# Patient Record
Sex: Male | Born: 1970 | Race: White | Hispanic: No | Smoking: Never smoker
Health system: Southern US, Community
[De-identification: ages and names within clinical notes are randomized; demographics above are authoritative.]

## PROBLEM LIST (undated history)

## (undated) DIAGNOSIS — I10 Essential (primary) hypertension: Secondary | ICD-10-CM

## (undated) DIAGNOSIS — E785 Hyperlipidemia, unspecified: Secondary | ICD-10-CM

## (undated) HISTORY — DX: Essential (primary) hypertension: I10

## (undated) HISTORY — PX: OTHER SURGICAL HISTORY: SHX169

## (undated) HISTORY — DX: Hyperlipidemia, unspecified: E78.5

---

## 2001-09-18 ENCOUNTER — Emergency Department (HOSPITAL_COMMUNITY): Admission: EM | Admit: 2001-09-18 | Discharge: 2001-09-18 | Payer: Self-pay | Admitting: Emergency Medicine

## 2001-09-18 ENCOUNTER — Encounter: Payer: Self-pay | Admitting: Emergency Medicine

## 2003-07-22 ENCOUNTER — Emergency Department (HOSPITAL_COMMUNITY): Admission: EM | Admit: 2003-07-22 | Discharge: 2003-07-22 | Payer: Self-pay

## 2004-09-08 ENCOUNTER — Ambulatory Visit: Payer: Self-pay | Admitting: Internal Medicine

## 2004-09-21 ENCOUNTER — Ambulatory Visit: Payer: Self-pay | Admitting: Internal Medicine

## 2004-12-03 ENCOUNTER — Ambulatory Visit: Payer: Self-pay | Admitting: Internal Medicine

## 2004-12-06 ENCOUNTER — Ambulatory Visit: Payer: Self-pay | Admitting: Internal Medicine

## 2005-02-01 ENCOUNTER — Emergency Department (HOSPITAL_COMMUNITY): Admission: EM | Admit: 2005-02-01 | Discharge: 2005-02-01 | Payer: Self-pay | Admitting: Family Medicine

## 2005-02-05 ENCOUNTER — Emergency Department (HOSPITAL_COMMUNITY): Admission: EM | Admit: 2005-02-05 | Discharge: 2005-02-05 | Payer: Self-pay | Admitting: Family Medicine

## 2005-04-19 ENCOUNTER — Ambulatory Visit: Payer: Self-pay | Admitting: Internal Medicine

## 2005-05-24 ENCOUNTER — Ambulatory Visit: Payer: Self-pay | Admitting: Internal Medicine

## 2005-07-26 ENCOUNTER — Ambulatory Visit: Payer: Self-pay | Admitting: Internal Medicine

## 2005-08-02 ENCOUNTER — Ambulatory Visit: Payer: Self-pay | Admitting: Internal Medicine

## 2005-08-04 ENCOUNTER — Encounter: Admission: RE | Admit: 2005-08-04 | Discharge: 2005-08-04 | Payer: Self-pay | Admitting: Internal Medicine

## 2005-08-18 ENCOUNTER — Ambulatory Visit: Payer: Self-pay | Admitting: Internal Medicine

## 2005-11-03 ENCOUNTER — Ambulatory Visit: Payer: Self-pay | Admitting: Internal Medicine

## 2005-11-10 ENCOUNTER — Ambulatory Visit: Payer: Self-pay | Admitting: Internal Medicine

## 2006-02-17 ENCOUNTER — Ambulatory Visit: Payer: Self-pay | Admitting: Internal Medicine

## 2006-03-14 ENCOUNTER — Ambulatory Visit: Payer: Self-pay | Admitting: Internal Medicine

## 2006-06-06 ENCOUNTER — Ambulatory Visit: Payer: Self-pay | Admitting: Internal Medicine

## 2006-06-13 ENCOUNTER — Ambulatory Visit: Payer: Self-pay | Admitting: Internal Medicine

## 2006-09-21 ENCOUNTER — Ambulatory Visit: Payer: Self-pay | Admitting: Internal Medicine

## 2006-09-21 LAB — CONVERTED CEMR LAB
ALT: 85 units/L — ABNORMAL HIGH (ref 0–40)
AST: 40 units/L — ABNORMAL HIGH (ref 0–37)
Albumin: 3.6 g/dL (ref 3.5–5.2)
Alkaline Phosphatase: 55 units/L (ref 39–117)
Bilirubin, Direct: 0.1 mg/dL (ref 0.0–0.3)
Chol/HDL Ratio, serum: 7
Cholesterol: 207 mg/dL (ref 0–200)
HDL: 29.7 mg/dL — ABNORMAL LOW (ref >39.0)
LDL DIRECT: 138.2 mg/dL
Total Bilirubin: 0.9 mg/dL (ref 0.3–1.2)
Total Protein: 6.9 g/dL (ref 6.0–8.3)
Triglyceride fasting, serum: 152 mg/dL — ABNORMAL HIGH (ref 0–149)
VLDL: 30 mg/dL (ref 0–40)

## 2006-10-09 ENCOUNTER — Ambulatory Visit: Payer: Self-pay | Admitting: Internal Medicine

## 2007-02-21 ENCOUNTER — Ambulatory Visit: Payer: Self-pay | Admitting: Internal Medicine

## 2007-02-21 LAB — CONVERTED CEMR LAB
ALT: 45 units/L — ABNORMAL HIGH (ref 0–40)
AST: 28 units/L (ref 0–37)
Albumin: 4.1 g/dL (ref 3.5–5.2)
Alkaline Phosphatase: 56 units/L (ref 39–117)
BUN: 17 mg/dL (ref 6–23)
Basophils Absolute: 0 10*3/uL (ref 0.0–0.1)
Basophils Relative: 0 % (ref 0.0–1.0)
Bilirubin, Direct: 0.1 mg/dL (ref 0.0–0.3)
CO2: 32 meq/L (ref 19–32)
Calcium: 9.9 mg/dL (ref 8.4–10.5)
Chloride: 106 meq/L (ref 96–112)
Cholesterol: 233 mg/dL (ref 0–200)
Creatinine, Ser: 1.2 mg/dL (ref 0.4–1.5)
Direct LDL: 147.1 mg/dL
Eosinophils Absolute: 0.4 10*3/uL (ref 0.0–0.6)
Eosinophils Relative: 5.8 % — ABNORMAL HIGH (ref 0.0–5.0)
GFR calc Af Amer: 89 mL/min
GFR calc non Af Amer: 73 mL/min
Glucose, Bld: 97 mg/dL (ref 70–99)
HCT: 43.4 % (ref 39.0–52.0)
HDL: 31.1 mg/dL — ABNORMAL LOW (ref >39.0)
Hemoglobin: 14.8 g/dL (ref 13.0–17.0)
Lymphocytes Relative: 31.6 % (ref 12.0–46.0)
MCHC: 34 g/dL (ref 30.0–36.0)
MCV: 84 fL (ref 78.0–100.0)
Monocytes Absolute: 0.5 10*3/uL (ref 0.2–0.7)
Monocytes Relative: 7.7 % (ref 3.0–11.0)
Neutro Abs: 3.8 10*3/uL (ref 1.4–7.7)
Neutrophils Relative %: 54.9 % (ref 43.0–77.0)
Platelets: 258 10*3/uL (ref 150–400)
Potassium: 4.6 meq/L (ref 3.5–5.1)
RBC: 5.17 M/uL (ref 4.22–5.81)
RDW: 11.7 % (ref 11.5–14.6)
Sodium: 144 meq/L (ref 135–145)
TSH: 0.84 microintl units/mL (ref 0.35–5.50)
Total Bilirubin: 1.3 mg/dL — ABNORMAL HIGH (ref 0.3–1.2)
Total CHOL/HDL Ratio: 7.5
Total Protein: 7.6 g/dL (ref 6.0–8.3)
Triglycerides: 177 mg/dL — ABNORMAL HIGH (ref 0–149)
VLDL: 35 mg/dL (ref 0–40)
WBC: 6.9 10*3/uL (ref 4.5–10.5)

## 2007-02-28 ENCOUNTER — Ambulatory Visit: Payer: Self-pay | Admitting: Internal Medicine

## 2007-03-09 IMAGING — US US ABDOMEN COMPLETE
1 series · 14 of 25 positions shown · non-contrast
Comparison: None.

CLINICAL DATA: Elevated liver function studies. 
 COMPLETE ABDOMINAL ULTRASOUND:
TECHNIQUE: Complete abdominal ultrasound examination was performed including evaluation of the liver, gallbladder, bile ducts, pancreas, kidneys, spleen, IVC, and abdominal aorta.

[Series 1: us abdomen complete · 0.39mm/px · 14 of 71 slices shown]
[im 1/71]
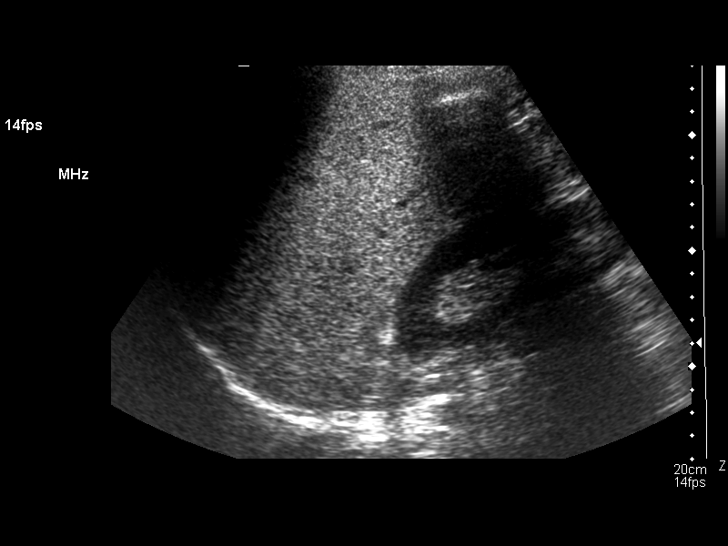
[im 6/71]
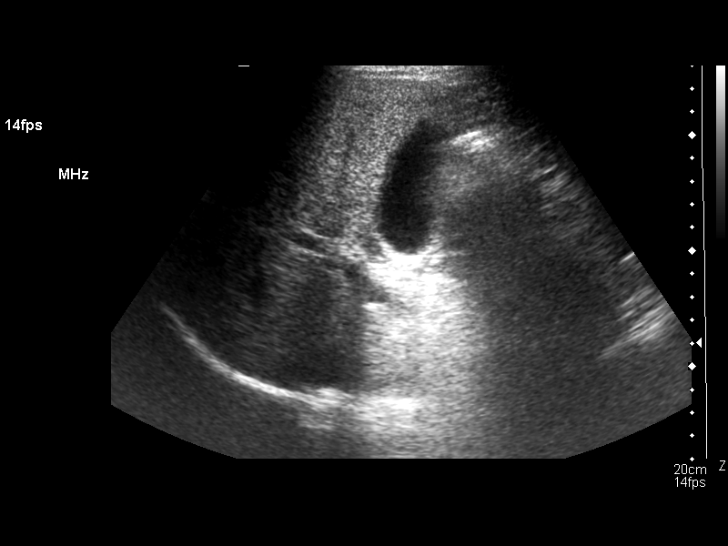
[im 12/71]
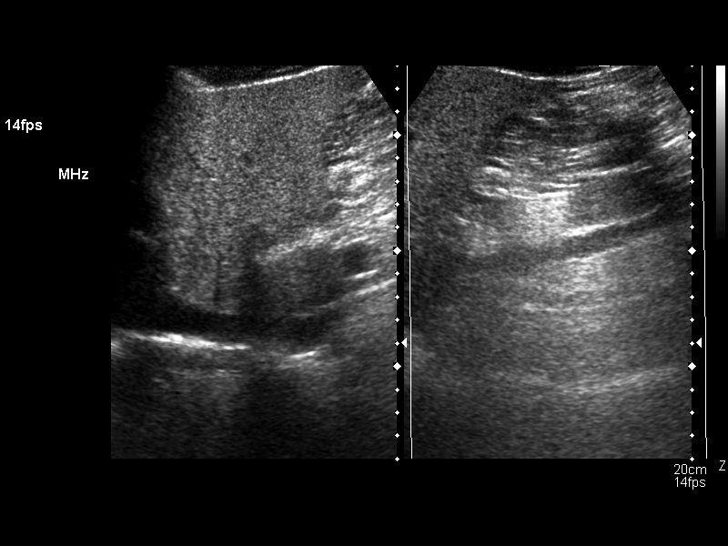
[im 18/71]
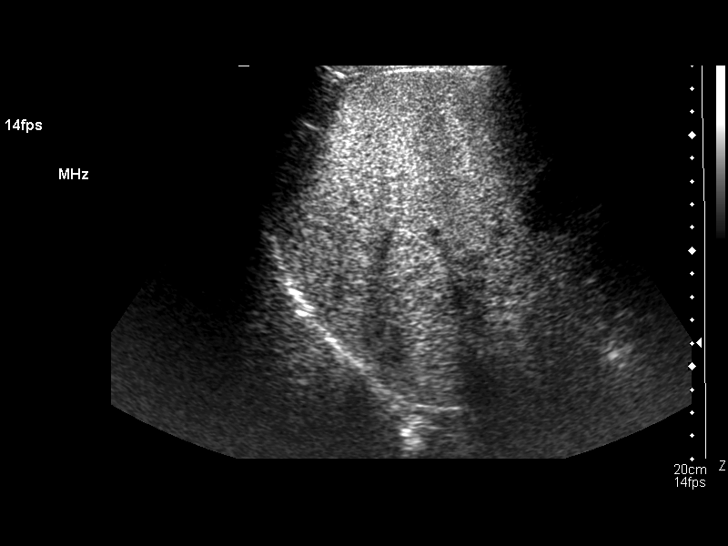
[im 24/71]
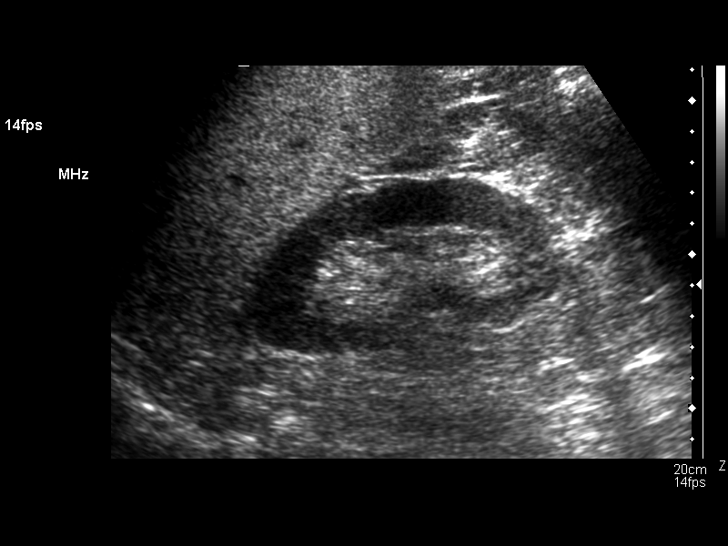
[im 27/71]
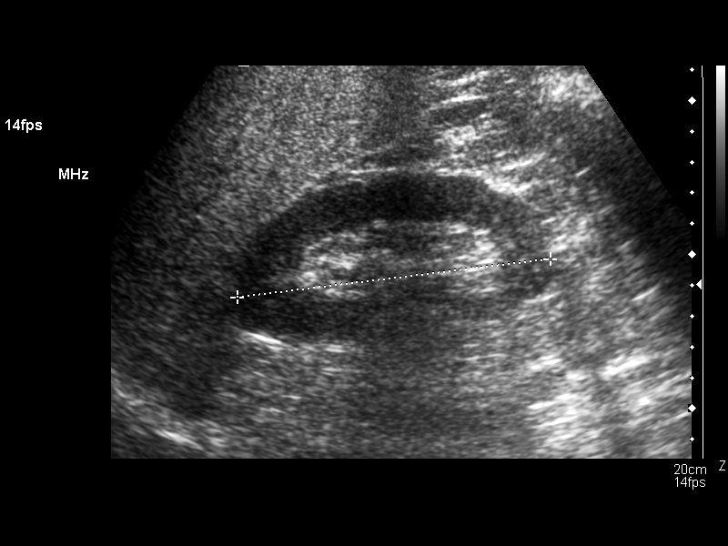
[im 33/71]
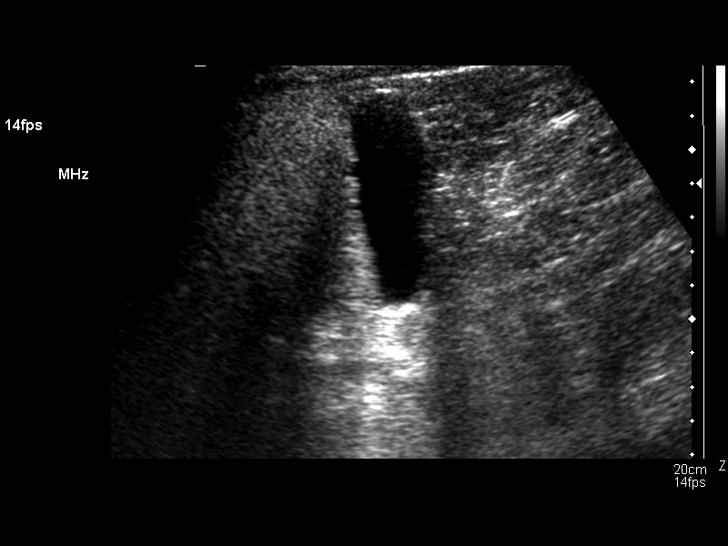
[im 38/71]
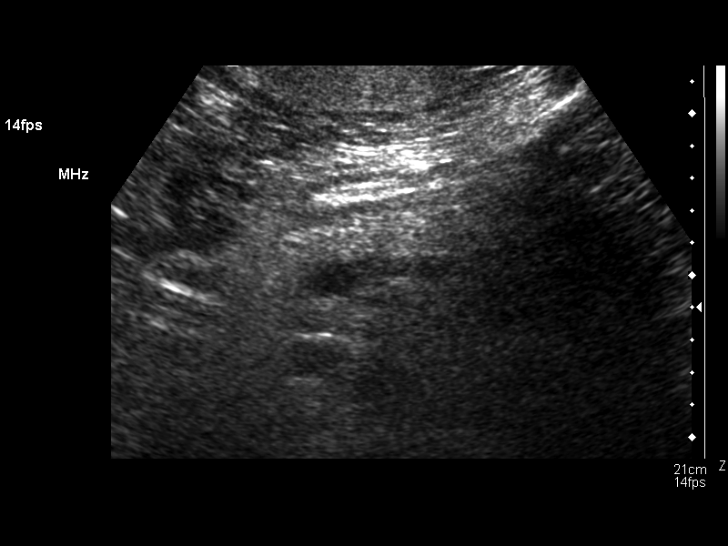
[im 44/71]
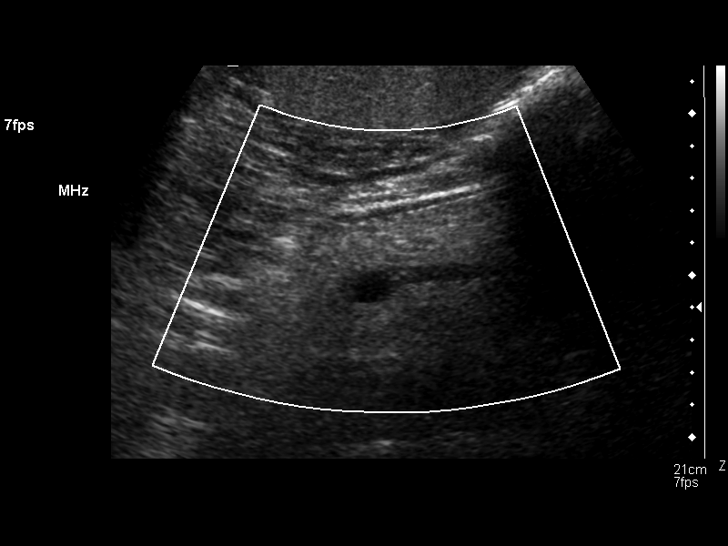
[im 47/71]
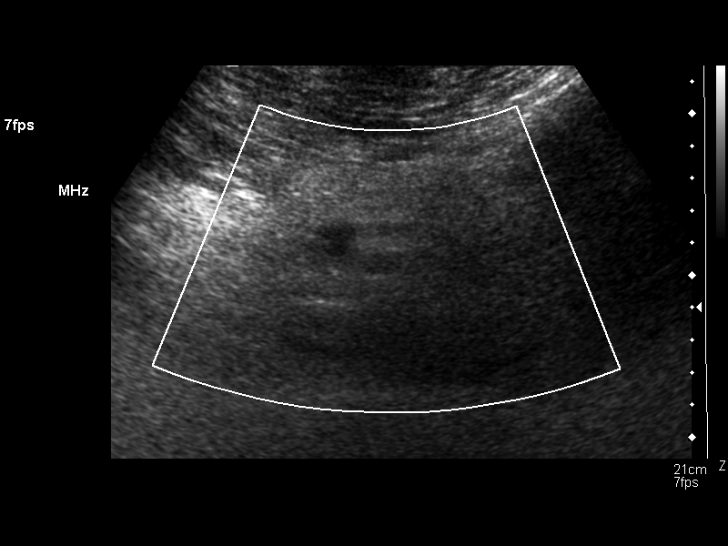
[im 53/71]
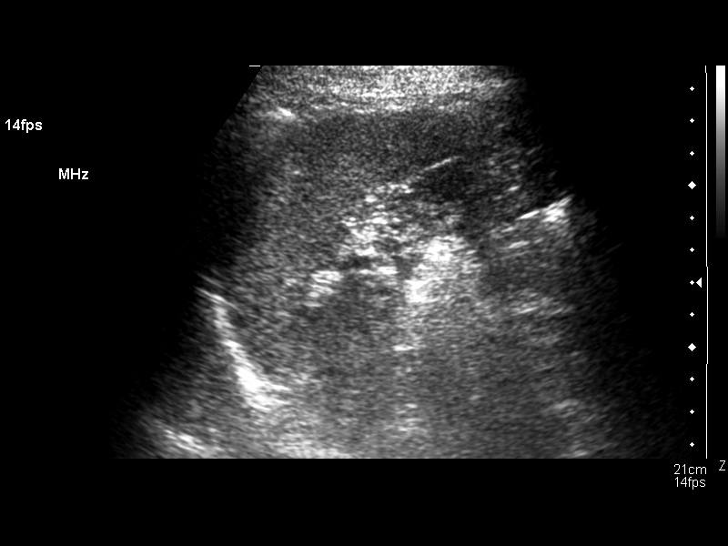
[im 59/71]
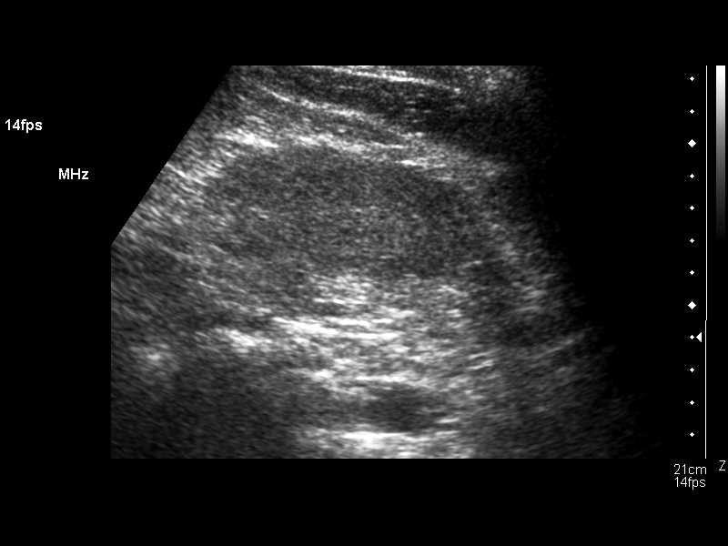
[im 65/71]
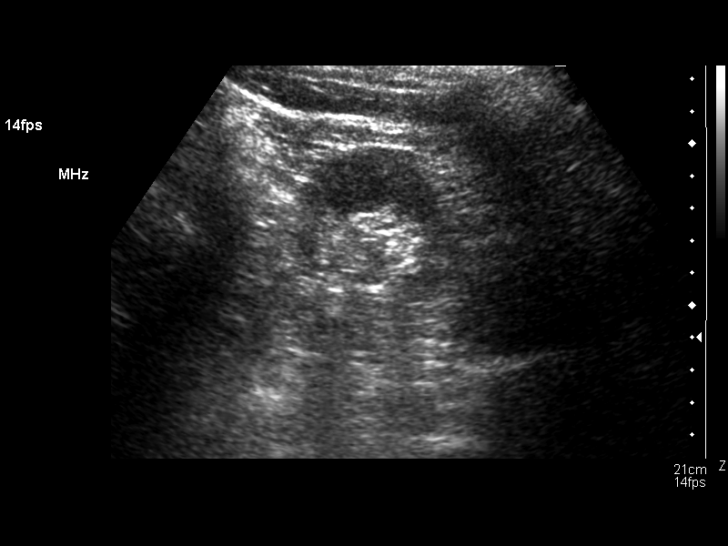
[im 71/71]
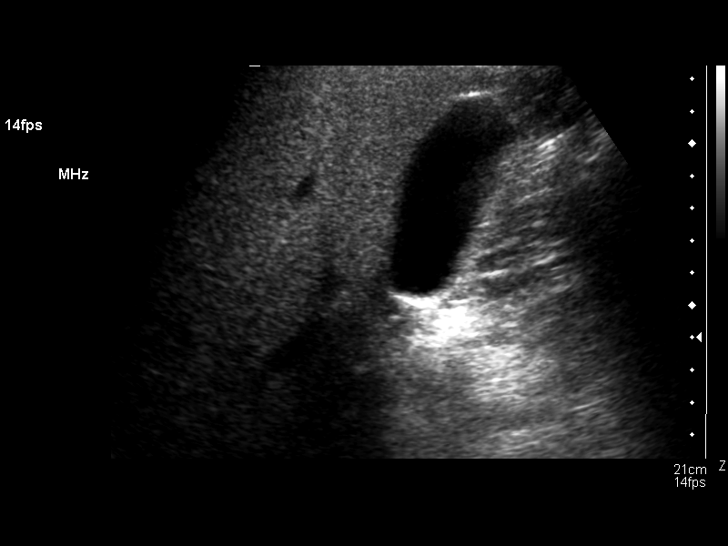

[14 of 25 positions shown; findings below may reference images not displayed]

Gallbladder and biliary tree normal.  Common duct 2.5mm.  The liver shows diffuse increased echogenicity with no focal lesions.  This is most commonly seen with fatty infiltration, but does not rule out diffuse hepatocellular disease.  Head and body of pancreas normal.  Tail obscured by overlying bowel gas.  Spleen and kidneys normal.  Aorta and IVC normal.  No ascites.
IMPRESSION: 1.  Echodense liver without focal lesions. 
 2.  Tail of pancreas inadequately visualized. 
 3.  Otherwise normal.

## 2007-04-13 DIAGNOSIS — I10 Essential (primary) hypertension: Secondary | ICD-10-CM | POA: Insufficient documentation

## 2007-04-13 DIAGNOSIS — E785 Hyperlipidemia, unspecified: Secondary | ICD-10-CM | POA: Insufficient documentation

## 2007-05-28 ENCOUNTER — Ambulatory Visit: Payer: Self-pay | Admitting: Internal Medicine

## 2007-05-28 LAB — CONVERTED CEMR LAB
ALT: 40 units/L (ref 0–53)
AST: 24 units/L (ref 0–37)
Albumin: 4.1 g/dL (ref 3.5–5.2)
Alkaline Phosphatase: 56 units/L (ref 39–117)
Bilirubin, Direct: 0.1 mg/dL (ref 0.0–0.3)
Cholesterol: 250 mg/dL (ref 0–200)
Direct LDL: 175.8 mg/dL
HDL: 34.5 mg/dL — ABNORMAL LOW (ref >39.0)
Total Bilirubin: 1.1 mg/dL (ref 0.3–1.2)
Total CHOL/HDL Ratio: 7.2
Total Protein: 7.2 g/dL (ref 6.0–8.3)
Triglycerides: 151 mg/dL — ABNORMAL HIGH (ref 0–149)
VLDL: 30 mg/dL (ref 0–40)

## 2007-06-04 ENCOUNTER — Ambulatory Visit: Payer: Self-pay | Admitting: Internal Medicine

## 2007-06-04 LAB — CONVERTED CEMR LAB
Cholesterol, target level: 200 mg/dL
HDL goal, serum: 40 mg/dL
LDL Goal: 130 mg/dL

## 2007-10-16 ENCOUNTER — Ambulatory Visit: Payer: Self-pay | Admitting: Internal Medicine

## 2007-10-16 LAB — CONVERTED CEMR LAB
Direct LDL: 140.2 mg/dL
HDL: 33.3 mg/dL — ABNORMAL LOW (ref >39.0)
Total Bilirubin: 1.2 mg/dL (ref 0.3–1.2)
Total Protein: 7.8 g/dL (ref 6.0–8.3)
Triglycerides: 141 mg/dL (ref 0–149)

## 2007-10-23 ENCOUNTER — Ambulatory Visit: Payer: Self-pay | Admitting: Internal Medicine

## 2007-10-23 DIAGNOSIS — M25549 Pain in joints of unspecified hand: Secondary | ICD-10-CM

## 2007-10-26 ENCOUNTER — Telehealth: Payer: Self-pay | Admitting: Internal Medicine

## 2007-11-12 ENCOUNTER — Telehealth: Payer: Self-pay | Admitting: Internal Medicine

## 2007-11-21 ENCOUNTER — Encounter: Payer: Self-pay | Admitting: Internal Medicine

## 2008-02-26 ENCOUNTER — Ambulatory Visit: Payer: Self-pay | Admitting: Internal Medicine

## 2008-02-26 DIAGNOSIS — T50995A Adverse effect of other drugs, medicaments and biological substances, initial encounter: Secondary | ICD-10-CM

## 2008-02-26 LAB — CONVERTED CEMR LAB
AST: 32 units/L (ref 0–37)
Albumin: 4.2 g/dL (ref 3.5–5.2)
Alkaline Phosphatase: 58 units/L (ref 39–117)
Cholesterol: 209 mg/dL (ref 0–200)
Total CHOL/HDL Ratio: 7
Total Protein: 7.4 g/dL (ref 6.0–8.3)

## 2008-03-04 ENCOUNTER — Ambulatory Visit: Payer: Self-pay | Admitting: Internal Medicine

## 2008-04-07 ENCOUNTER — Telehealth: Payer: Self-pay | Admitting: Internal Medicine

## 2008-04-08 ENCOUNTER — Ambulatory Visit: Payer: Self-pay | Admitting: Internal Medicine

## 2008-04-08 DIAGNOSIS — G610 Guillain-Barre syndrome: Secondary | ICD-10-CM

## 2008-07-15 ENCOUNTER — Telehealth: Payer: Self-pay | Admitting: Internal Medicine

## 2008-08-07 ENCOUNTER — Ambulatory Visit: Payer: Self-pay | Admitting: Internal Medicine

## 2008-08-25 ENCOUNTER — Ambulatory Visit: Payer: Self-pay | Admitting: Internal Medicine

## 2008-11-21 ENCOUNTER — Ambulatory Visit: Payer: Self-pay | Admitting: Internal Medicine

## 2008-11-21 LAB — CONVERTED CEMR LAB
ALT: 86 units/L — ABNORMAL HIGH (ref 0–53)
Bilirubin, Direct: 0.1 mg/dL (ref 0.0–0.3)
HDL: 29.6 mg/dL — ABNORMAL LOW (ref >39.0)
Total Bilirubin: 1.5 mg/dL — ABNORMAL HIGH (ref 0.3–1.2)
Total CHOL/HDL Ratio: 8.1

## 2008-12-03 ENCOUNTER — Ambulatory Visit: Payer: Self-pay | Admitting: Internal Medicine

## 2008-12-03 DIAGNOSIS — E8881 Metabolic syndrome: Secondary | ICD-10-CM

## 2009-01-28 ENCOUNTER — Ambulatory Visit: Payer: Self-pay | Admitting: Internal Medicine

## 2009-01-28 LAB — CONVERTED CEMR LAB
Albumin: 3.9 g/dL (ref 3.5–5.2)
Bilirubin, Direct: 0 mg/dL (ref 0.0–0.3)
Cholesterol: 177 mg/dL (ref 0–200)
HDL: 29.2 mg/dL — ABNORMAL LOW (ref >39.00)
LDL Cholesterol: 118 mg/dL — ABNORMAL HIGH (ref 0–99)
Total Protein: 7.4 g/dL (ref 6.0–8.3)
Triglycerides: 151 mg/dL — ABNORMAL HIGH (ref 0.0–149.0)
VLDL: 30.2 mg/dL (ref 0.0–40.0)

## 2009-02-05 ENCOUNTER — Ambulatory Visit: Payer: Self-pay | Admitting: Internal Medicine

## 2009-02-05 DIAGNOSIS — K219 Gastro-esophageal reflux disease without esophagitis: Secondary | ICD-10-CM | POA: Insufficient documentation

## 2009-07-03 ENCOUNTER — Ambulatory Visit: Payer: Self-pay | Admitting: Internal Medicine

## 2009-07-03 LAB — CONVERTED CEMR LAB
AST: 48 units/L — ABNORMAL HIGH (ref 0–37)
Albumin: 4 g/dL (ref 3.5–5.2)
Alkaline Phosphatase: 52 units/L (ref 39–117)
BUN: 15 mg/dL (ref 6–23)
Basophils Absolute: 0.1 10*3/uL (ref 0.0–0.1)
Bilirubin Urine: NEGATIVE
CO2: 26 meq/L (ref 19–32)
Chloride: 106 meq/L (ref 96–112)
Cholesterol: 150 mg/dL (ref 0–200)
Creatinine, Ser: 1.2 mg/dL (ref 0.4–1.5)
Glucose, Urine, Semiquant: NEGATIVE
Hemoglobin: 14.1 g/dL (ref 13.0–17.0)
Ketones, urine, test strip: NEGATIVE
Lymphocytes Relative: 31.4 % (ref 12.0–46.0)
Monocytes Relative: 6.8 % (ref 3.0–12.0)
Neutro Abs: 3.7 10*3/uL (ref 1.4–7.7)
Potassium: 4.4 meq/L (ref 3.5–5.1)
RDW: 11.8 % (ref 11.5–14.6)
Specific Gravity, Urine: 1.02
Triglycerides: 141 mg/dL (ref 0.0–149.0)
pH: 5

## 2009-07-09 ENCOUNTER — Ambulatory Visit: Payer: Self-pay | Admitting: Internal Medicine

## 2009-07-09 DIAGNOSIS — B353 Tinea pedis: Secondary | ICD-10-CM

## 2009-08-03 ENCOUNTER — Ambulatory Visit: Payer: Self-pay | Admitting: Internal Medicine

## 2009-08-03 LAB — CONVERTED CEMR LAB
AST: 38 units/L — ABNORMAL HIGH (ref 0–37)
Albumin: 4.1 g/dL (ref 3.5–5.2)
Alkaline Phosphatase: 59 units/L (ref 39–117)
Total Protein: 7.4 g/dL (ref 6.0–8.3)

## 2009-08-10 ENCOUNTER — Ambulatory Visit: Payer: Self-pay | Admitting: Internal Medicine

## 2009-11-05 ENCOUNTER — Ambulatory Visit: Payer: Self-pay | Admitting: Internal Medicine

## 2009-11-05 ENCOUNTER — Telehealth: Payer: Self-pay | Admitting: Internal Medicine

## 2009-12-03 ENCOUNTER — Ambulatory Visit: Payer: Self-pay | Admitting: Internal Medicine

## 2009-12-03 LAB — CONVERTED CEMR LAB
AST: 77 units/L — ABNORMAL HIGH (ref 0–37)
Alkaline Phosphatase: 52 units/L (ref 39–117)
Bilirubin, Direct: 0 mg/dL (ref 0.0–0.3)
Total Bilirubin: 0.9 mg/dL (ref 0.3–1.2)
Total CHOL/HDL Ratio: 4

## 2009-12-10 ENCOUNTER — Ambulatory Visit: Payer: Self-pay | Admitting: Internal Medicine

## 2009-12-10 DIAGNOSIS — K7689 Other specified diseases of liver: Secondary | ICD-10-CM | POA: Insufficient documentation

## 2010-03-05 ENCOUNTER — Ambulatory Visit: Payer: Self-pay | Admitting: Internal Medicine

## 2010-03-10 LAB — CONVERTED CEMR LAB
ALT: 145 units/L — ABNORMAL HIGH (ref 0–53)
AST: 63 units/L — ABNORMAL HIGH (ref 0–37)
Albumin: 4.3 g/dL (ref 3.5–5.2)
Alkaline Phosphatase: 54 units/L (ref 39–117)
Bilirubin, Direct: 0.1 mg/dL (ref 0.0–0.3)
Total Bilirubin: 0.9 mg/dL (ref 0.3–1.2)
Total Protein: 7.4 g/dL (ref 6.0–8.3)

## 2010-03-15 ENCOUNTER — Ambulatory Visit: Payer: Self-pay | Admitting: Internal Medicine

## 2010-07-22 ENCOUNTER — Ambulatory Visit: Payer: Self-pay | Admitting: Internal Medicine

## 2010-07-22 DIAGNOSIS — L570 Actinic keratosis: Secondary | ICD-10-CM

## 2010-07-22 LAB — CONVERTED CEMR LAB: Total Bilirubin: 0.8 mg/dL (ref 0.3–1.2)

## 2010-10-21 ENCOUNTER — Telehealth: Payer: Self-pay | Admitting: *Deleted

## 2010-10-21 DIAGNOSIS — J342 Deviated nasal septum: Secondary | ICD-10-CM

## 2010-10-21 NOTE — Assessment & Plan Note (Signed)
Summary: possible strep throat./bmw   Vital Signs:  Patient profile:   40 year old male Height:      68 inches Weight:      204 pounds BMI:     31.13 Temp:     99.2 degrees F oral Pulse rate:   76 / minute Resp:     14 per minute BP sitting:   130 / 80  (left arm)  Vitals Entered By: Willy Eddy, LPN (November 05, 2009 9:19 AM) CC: sore throat- temp 103.5 last night at home- aches all over   CC:  sore throat- temp 103.5 last night at home- aches all over.  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) 8)  Uns Advrs Eff Oth Rx Medicinal&biological Sbstnc  (ICD-995.29) 9)  Joint Pain, Hand  (ICD-719.44) 10)  Hypertension  (ICD-401.9) 11)  Hyperlipidemia  (ICD-272.4)  Current Problems (verified): 1)  Sore Throat  (ICD-462) 2)  Preventive Health Care  (ICD-V70.0) 3)  Acute Maxillary Sinusitis  (ICD-461.0) 4)  Tinea Pedis  (ICD-110.4) 5)  Esophageal Reflux  (ICD-530.81) 6)  Dysmetabolic Syndrome  (ICD-277.7) 7)  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 Once Daily 2)  Crestor 20 Mg Tabs (Rosuvastatin Calcium) .... Monday and Friday  Current Medications (verified): 1)  Atacand 16 Mg  Tabs (Candesartan Cilexetil)  .... Once Daily 2)  Crestor 20 Mg Tabs (Rosuvastatin Calcium) .... Monday and Friday 3)  Tamiflu 75 Mg Caps (Oseltamivir Phosphate) .... Take One Tablet By Mouth Twice A Day For Five Days 4)  Azithromycin 250 Mg Tabs (Azithromycin) .... Two By Mouth Now and Then One By Mouth Daily For 4 Days  Allergies (verified): No Known Drug Allergies  Past History:  Family History: Last updated: 04/13/2007 Family History Hypertension Fam hx MI  Social History: Last updated: 04/13/2007 Never Smoked Alcohol use-yes Drug use-no Married  Risk Factors: Smoking Status: never (11/05/2009)  Past medical, surgical, family and social histories (including risk factors) reviewed, and no changes noted (except as noted below).  Past Medical History: Reviewed history from 06/04/2007 and no changes required. Hypertension Hyperlipidemia  Past Surgical History: Reviewed history from 06/04/2007 and no changes required. Denies surgical history  Family History: Reviewed history from 04/13/2007 and no changes required. Family History Hypertension Fam hx MI  Social History: Reviewed history from 04/13/2007 and no changes required. Never Smoked Alcohol use-yes Drug use-no Married  Review of Systems  The patient denies anorexia, fever, weight loss, weight gain, vision loss, decreased hearing, hoarseness, chest pain, syncope, dyspnea on exertion, peripheral edema, prolonged cough, headaches, hemoptysis, abdominal pain, melena, hematochezia, severe indigestion/heartburn, hematuria, incontinence, genital sores, muscle weakness, suspicious skin lesions, transient blindness, difficulty walking, depression, unusual weight change, abnormal bleeding, enlarged lymph nodes,  angioedema, and breast masses.    Physical Exam  General:  Well-developed,well-nourished,in no acute distress; alert,appropriate and cooperative throughout examination Head:  Normocephalic and atraumatic without obvious abnormalities. No  apparent alopecia or balding. Eyes:  pupils equal and pupils round.   Ears:  R ear normal and L ear normal.   Nose:  mucosal erythema and mucosal edema.   Mouth:  posterior lymphoid hypertrophy and pharyngeal crowding.   Neck:  supple and no masses.   Lungs:  normal respiratory effort and no wheezes.   Heart:  normal rate and regular rhythm.   Abdomen:  soft, non-tender, and normal bowel sounds.   mid line ventral hernia Rectal:  no external abnormalities and no hemorrhoids.   Msk:  normal ROM, no joint tenderness, and no joint swelling.     Impression & Recommendations:  Problem # 1:  INFLUENZA WITH OTHER MANIFESTATIONS (ICD-487.8)  Rest, increase fluids, use Tylenol [phone] mg every 4-6 hours, and avoid contact with others. call office if no improvement in 5-7 days or if symptoms worsen.   Complete Medication List: 1)  Atacand 16 Mg Tabs (Candesartan cilexetil) .... Once daily 2)  Crestor 20 Mg Tabs (Rosuvastatin calcium) .... Monday and friday 3)  Tamiflu 75 Mg Caps (Oseltamivir phosphate) .... Take one tablet by mouth twice a day for five days 4)  Azithromycin 250 Mg Tabs (Azithromycin) .... Two by mouth now and then one by mouth daily for 4 days  Patient Instructions: 1)  Advil for the chills 2)  drink lot and lots of fluids ( ginger ale, gator aid) 3)  theraflu 4)  and finish all of the tamiflu and the zpack 5)  stay home for the next three days yoyu are contageous! Prescriptions: AZITHROMYCIN 250 MG TABS (AZITHROMYCIN) two by mouth now and then one by mouth daily for 4 days  #6 x 0   Entered and Authorized by:   Stacie Glaze MD   Signed by:   Stacie Glaze MD on 11/05/2009   Method used:   Electronically to        [facility]  [place] [address] (retail)       [address]       [place], [place]  [address]       Ph: [phone] or [phone]       Fax: [phone]   RxID:   [id number] TAMIFLU 75 MG CAPS (OSELTAMIVIR PHOSPHATE) take one tablet by mouth twice a day for  five days  #10 x 0   Entered and Authorized by:   Stacie Glaze MD   Signed by:   Stacie Glaze MD on 11/05/2009   Method used:   Electronically to        [facility]  [place] [address] (retail)       [address]       [place], [place]  [address]       Ph: [phone] or [phone]       Fax: [phone]   RxID:   [id number]   Laboratory Results    Other Tests  Rapid Strep: negative Comments: Rita Ohara  November 05, 2009 9:23 AM   Kit Test Internal QC: Negative   (Normal Range: Negative)

## 2010-10-21 NOTE — Progress Notes (Signed)
Summary: strept stroat? flu?  Phone Note Call from Patient   Caller: Patient Call For: Stacie Glaze MD Summary of Call: [phone] [facility]) Pt is running fever 103 yesterday and complaining of extremely sore throat.  Feels like Strept and would like to see Dr. Lovell Sheehan today.  Aching all over and cough also. Initial call taken by: Lynann Beaver CMA,  November 05, 2009 8:09 AM  New Problems: SORE THROAT (ICD-462)   New Problems: SORE THROAT (ICD-462)

## 2010-10-21 NOTE — Telephone Encounter (Signed)
Pt.notified

## 2010-10-21 NOTE — Telephone Encounter (Signed)
Referral placed for ENT surgery for deviated septum

## 2010-10-21 NOTE — Assessment & Plan Note (Signed)
Summary: 3 month rov/njr pt rsc/njr   Vital Signs:  Patient profile:   40 year old male Height:      68 inches Weight:      204 pounds BMI:     31.13 BP sitting:   136 / 80  Vitals Entered By: Willy Eddy, LPN (July 22, 2010 9:18 AM)  Primary Care Sharlee Rufino:  Stacie Glaze MD   History of Present Illness: follow up the pt tore a mucle in hjis leg palying soft ball and has not been able to exercize he hopes that he will be able to resume weight loss soon with exercize has been on the mdications for both his blood pressure and the lipids  Hypertension History:      He denies headache, chest pain, palpitations, dyspnea with exertion, orthopnea, PND, peripheral edema, visual symptoms, neurologic problems, syncope, and side effects from treatment.        Positive major cardiovascular risk factors include hyperlipidemia and hypertension.  Negative major cardiovascular risk factors include male age less than 47 years old, no history of diabetes, negative family history for ischemic heart disease, and non-tobacco-user status.        Further assessment for target organ damage reveals no history of ASHD, cardiac end-organ damage (CHF/LVH), stroke/TIA, peripheral vascular disease, renal insufficiency, or hypertensive retinopathy.     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 Once Daily 2)  Crestor 20 Mg Tabs (Rosuvastatin Calcium) .... Monday and Friday  Current Medications (verified): 1)  Atacand 16 Mg  Tabs (Candesartan Cilexetil) .... Once Daily 2)  Crestor 20 Mg Tabs (Rosuvastatin Calcium) .... Monday and Friday  Allergies (verified): No Known Drug Allergies  Past History:  Family History: Last updated: 04/13/2007 Family History Hypertension Fam hx MI  Social History: Last updated: 04/13/2007 Never Smoked Alcohol use-yes Drug use-no Married  Risk Factors: Smoking Status: never (07/22/2010)  Past medical, surgical, family and social histories (including risk factors) reviewed, and no changes noted (except as noted below).  Past Medical History: Reviewed history from 06/04/2007 and no changes required. Hypertension Hyperlipidemia  Past Surgical History: Reviewed history from 06/04/2007 and no changes required. Denies surgical history  Family History: Reviewed history from 04/13/2007 and no changes required. Family History Hypertension Fam hx MI  Social History: Reviewed history from 04/13/2007 and no changes required. Never Smoked Alcohol use-yes Drug use-no Married  Review of Systems  The patient denies anorexia, fever, weight loss, weight gain, vision loss, decreased hearing, hoarseness, chest pain, syncope, dyspnea on exertion, peripheral edema, prolonged cough, headaches, hemoptysis, abdominal pain, melena, hematochezia, severe indigestion/heartburn, hematuria, incontinence, genital sores, muscle weakness, suspicious skin lesions, transient blindness, difficulty walking, depression, unusual weight change, abnormal bleeding, enlarged lymph nodes, angioedema, and breast masses.  Physical Exam  General:  Well-developed,well-nourished,in no acute distress; alert,appropriate and cooperative  throughout examination Head:  Normocephalic and atraumatic without obvious abnormalities. No apparent alopecia or balding. Eyes:  pupils equal and pupils round.   Ears:  R ear normal and L ear normal.   Nose:  mucosal erythema and mucosal edema.   Mouth:  posterior lymphoid hypertrophy and pharyngeal crowding.   Neck:  supple and no masses.   Lungs:  normal respiratory effort and no wheezes.   Heart:  normal rate and regular rhythm.   Abdomen:  soft, non-tender, and normal bowel sounds.   mid line ventral hernia Msk:  normal ROM, no joint tenderness, and no joint swelling.     Impression & Recommendations:  Problem # 1:  HYPERLIPIDEMIA (ICD-272.4)  His updated medication list for this problem includes:    Crestor 20 Mg Tabs (Rosuvastatin calcium) ..... Monday and friday  Labs Reviewed: SGOT: 63 (03/05/2010)   SGPT: 145 (03/05/2010)  Lipid Goals: Chol Goal: 200 (06/04/2007)   HDL Goal: 40 (06/04/2007)   LDL Goal: 130 (06/04/2007)   TG Goal: 150 (06/04/2007)  10 Yr Risk Heart Disease: 3 % Prior 10 Yr Risk Heart Disease: 4 % (03/15/2010)   HDL:38.60 (12/03/2009), 30.70 (07/03/2009)  LDL:85 (12/03/2009), 91 (07/03/2009)  Chol:147 (12/03/2009), 150 (07/03/2009)  Trig:118.0 (12/03/2009), 141.0 (07/03/2009)  Problem # 2:  HYPERTENSION (ICD-401.9) Assessment: Unchanged  His updated medication list for this problem includes:    Atacand 16 Mg Tabs (Candesartan cilexetil) ..... Once daily  BP today: 136/80 Prior BP: 140/90 (03/15/2010)  10 Yr Risk Heart Disease: 3 % Prior 10 Yr Risk Heart Disease: 4 % (03/15/2010)  Labs Reviewed: K+: 4.4 (07/03/2009) Creat: : 1.2 (07/03/2009)   Chol: 147 (12/03/2009)   HDL: 38.60 (12/03/2009)   LDL: 85 (12/03/2009)   TG: 118.0 (12/03/2009)  Problem # 3:  ACTINIC KERATOSIS (ICD-702.0)  the lesion was identifies as a  AK on leg        and 40 seconds of cryotherapy with the liguid nitrogen gun was apllied to the site. The pt tolerated the procedure  and post procedure care was discussed  Orders: Cryotherapy/Destruction benign or premalignant lesion (1st lesion)  (17000)  Complete Medication List: 1)  Atacand 16 Mg Tabs (Candesartan cilexetil) .... Once daily 2)  Crestor 20 Mg Tabs (Rosuvastatin calcium) .... Monday and friday  Other Orders: TLB-Hepatic/Liver Function Pnl (80076-HEPATIC) Venipuncture (21308)  Hypertension Assessment/Plan:      The patient's hypertensive risk group is category B: At least one risk factor (excluding diabetes) with no target organ damage.  His calculated 10 year risk of coronary heart disease is 3 %.  Today's blood pressure is 136/80.  His blood pressure goal is < 140/90.  Patient Instructions: 1)  Please schedule a follow-up appointment in 3 months. 2)  Hepatic Panel prior to visit, ICD-9:995.20 3)  Lipid Panel prior to visit, ICD-9:272.4   Orders Added: 1)  Est. Patient Level IV [65784] 2)  TLB-Hepatic/Liver Function Pnl [80076-HEPATIC] 3)  Venipuncture [69629] 4)  Cryotherapy/Destruction benign or premalignant lesion (1st lesion)  [17000]  Appended Document: Orders Update    Clinical Lists Changes  Orders: Added new Service order of Specimen Handling (52841) - Signed

## 2010-10-21 NOTE — Telephone Encounter (Signed)
Would like a referral to ENT due to possible deviated septum and difficulty with URI symptoms constantly

## 2010-10-21 NOTE — Assessment & Plan Note (Signed)
Summary: 4 MONTH ROV/NJR pt rsc/njr   Vital Signs:  Patient profile:   40 year old male Height:      68 inches Weight:      199 pounds BMI:     30.37 Temp:     98.2 degrees F oral Pulse rate:   76 / minute Resp:     14 per minute BP sitting:   110 / 70  (left arm)  Vitals Entered By: Willy Eddy, LPN (December 10, 2009 11:42 AM)  Nutrition Counseling: Patient's BMI is greater than 25 and therefore counseled on weight management options. CC: roa labs, Hypertension Management   CC:  roa labs and Hypertension Management.  History of Present Illness: follow up of lipids and elevated liver enzymes  Hypertension History:      He denies headache, chest pain, palpitations, dyspnea with exertion, orthopnea, PND, peripheral edema, visual symptoms, neurologic problems, syncope, and side effects from treatment.        Positive major cardiovascular risk factors include hyperlipidemia and hypertension.  Negative major cardiovascular risk factors include male age less than 77 years old, no history of diabetes, negative family history for ischemic heart disease, and non-tobacco-user status.        Further assessment for target organ damage reveals no history of ASHD, cardiac end-organ damage (CHF/LVH), stroke/TIA, peripheral vascular disease, renal insufficiency, or hypertensive retinopathy.     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) 9)  Uns Advrs Eff Oth Rx Medicinal&biological Sbstnc  (ICD-995.29) 10)  Joint Pain, Hand  (ICD-719.44) 11)  Hypertension  (ICD-401.9) 12)  Hyperlipidemia   (ICD-272.4)  Current Problems (verified): 1)  Influenza With Other Manifestations  (ICD-487.8) 2)  Sore Throat  (ICD-462) 3)  Preventive Health Care  (ICD-V70.0) 4)  Acute Maxillary Sinusitis  (ICD-461.0) 5)  Tinea Pedis  (ICD-110.4) 6)  Esophageal Reflux  (ICD-530.81) 7)  Dysmetabolic Syndrome  (ICD-277.7) 8)  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 Once Daily 2)  Crestor 20 Mg Tabs (Rosuvastatin Calcium) .... Monday and Friday 3)  Azithromycin 250 Mg Tabs (Azithromycin) .... Two By Mouth Now and Then One By Mouth Daily For 4 Days  Current Medications (verified): 1)  Atacand 16 Mg  Tabs (Candesartan Cilexetil) .... Once Daily 2)  Crestor 20 Mg Tabs (Rosuvastatin Calcium) .... Monday and Friday  Allergies (verified): No Known Drug Allergies  Past History:  Family History: Last updated: 04/13/2007 Family History Hypertension Fam hx MI  Social History: Last updated: 04/13/2007 Never Smoked Alcohol use-yes Drug use-no Married  Risk Factors: Smoking Status: never (12/10/2009)  Past medical, surgical, family and social histories (including risk factors) reviewed, and no changes noted (except as noted below).  Past Medical History: Reviewed history from 06/04/2007 and no changes required. Hypertension Hyperlipidemia  Past Surgical History: Reviewed history from 06/04/2007 and no changes required. Denies surgical history  Family History: Reviewed history from 04/13/2007  and no changes required. Family History Hypertension Fam hx MI  Social History: Reviewed history from 04/13/2007 and no changes required. Never Smoked Alcohol use-yes Drug use-no Married  Review of Systems  The patient denies anorexia, fever,  weight loss, weight gain, vision loss, decreased hearing, hoarseness, chest pain, syncope, dyspnea on exertion, peripheral edema, prolonged cough, headaches, hemoptysis, abdominal pain, melena, hematochezia, severe indigestion/heartburn, hematuria, incontinence, genital sores, muscle weakness, suspicious skin lesions, transient blindness, difficulty walking, depression, unusual weight change, abnormal bleeding, enlarged lymph nodes, angioedema, breast masses, and testicular masses.    Physical Exam  General:  Well-developed,well-nourished,in no acute distress; alert,appropriate and cooperative throughout examination Head:  Normocephalic and atraumatic without obvious abnormalities. No apparent alopecia or balding. Eyes:  pupils equal and pupils round.   Ears:  R ear normal and L ear normal.   Nose:  mucosal erythema and mucosal edema.   Mouth:  posterior lymphoid hypertrophy and pharyngeal crowding.   Neck:  supple and no masses.   Lungs:  normal respiratory effort and no wheezes.   Heart:  normal rate and regular rhythm.   Abdomen:  soft, non-tender, and normal bowel sounds.   mid line ventral hernia Msk:  normal ROM, no joint tenderness, and no joint swelling.     Impression & Recommendations:  Problem # 1:  HYPERLIPIDEMIA (ICD-272.4)  His updated medication list for this problem includes:    Crestor 20 Mg Tabs (Rosuvastatin calcium) ..... 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 Once daily 2)  Crestor 20 Mg Tabs (Rosuvastatin calcium) .... Monday and friday  Hypertension Assessment/Plan:      The patient's hypertensive risk group is category B: At least one risk factor (excluding diabetes) with no target organ damage.  His calculated 10 year risk of coronary heart disease is 2 %.  Today's blood pressure is 110/70.  His blood pressure goal is < 140/90.  Patient Instructions: 1)  Please schedule a follow-up appointment in 3 months. 2)  Hepatic Panel prior to visit, ICD-9: 794

## 2010-10-21 NOTE — Assessment & Plan Note (Signed)
Summary: 3 month fup//ccm/pt rescd from bump//ccm   Vital Signs:  Patient profile:   40 year old male Height:      68 inches Weight:      199 pounds BMI:     30.37 Temp:     98.2 degrees F oral Pulse rate:   76 / minute Resp:     14 per minute BP sitting:   140 / 90  (left arm)  Vitals Entered By: Willy Eddy, LPN (March 15, 2010 11:39 AM) CC: roa labs, Hypertension Management   CC:  roa labs and Hypertension Management.  History of Present Illness: the pt has noted increased liver fuction in the past and has had fatty liver diagnosis with lipids has been on  the creastor twice a week weight lass has not been achieved   Hypertension History:      He denies headache, chest pain, palpitations, dyspnea with exertion, orthopnea, PND, peripheral edema, visual symptoms, neurologic problems, syncope, and side effects from treatment.        Positive major cardiovascular risk factors include hyperlipidemia and hypertension.  Negative major cardiovascular risk factors include male age less than 41 years old, no history of diabetes, negative family history for ischemic heart disease, and non-tobacco-user status.        Further assessment for target organ damage reveals no history of ASHD, cardiac end-organ damage (CHF/LVH), stroke/TIA, peripheral vascular disease, renal insufficiency, or hypertensive retinopathy.     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 Once Daily 2)  Crestor 20 Mg Tabs (Rosuvastatin Calcium) .... Monday and Friday  Allergies (verified): No Known Drug Allergies  Past History:  Family History: Last updated: 04/13/2007 Family History Hypertension Fam hx MI  Social History: Last updated: 04/13/2007 Never Smoked Alcohol use-yes Drug use-no Married  Risk Factors: Smoking Status: never (03/15/2010)  Past medical, surgical, family and social histories (including risk factors) reviewed, and no changes noted (except as noted below).  Past Medical History: Reviewed history from 06/04/2007 and no changes required. Hypertension Hyperlipidemia  Past Surgical History: Reviewed history from 06/04/2007 and no changes required. Denies surgical history  Family History: Reviewed history from 04/13/2007 and no changes required. Family History Hypertension Fam hx MI  Social History: Reviewed history from 04/13/2007 and no changes required. Never Smoked Alcohol use-yes Drug use-no Married  Review of Systems  The patient denies anorexia, fever, weight loss, weight gain, vision loss, decreased hearing, hoarseness, chest pain, syncope, dyspnea on exertion, peripheral edema, prolonged cough, headaches, hemoptysis, abdominal pain, melena, hematochezia, severe indigestion/heartburn, hematuria, incontinence, genital sores, muscle weakness, suspicious skin lesions, transient blindness, difficulty walking, depression, unusual weight change, abnormal bleeding, enlarged lymph nodes, angioedema, and breast masses.    Physical Exam  General:  Well-developed,well-nourished,in no acute distress; alert,appropriate and cooperative throughout examination Head:  Normocephalic and atraumatic without obvious abnormalities. No  apparent alopecia or balding. Eyes:  pupils  equal and pupils round.   Ears:  R ear normal and L ear normal.   Nose:  mucosal erythema and mucosal edema.   Mouth:  posterior lymphoid hypertrophy and pharyngeal crowding.   Lungs:  normal respiratory effort and no wheezes.   Heart:  normal rate and regular rhythm.   Abdomen:  soft, non-tender, and normal bowel sounds.   mid line ventral hernia Msk:  normal ROM, no joint tenderness, and no joint swelling.     Impression & Recommendations:  Problem # 1:  HYPERTENSION (ICD-401.9)  His updated medication list for this problem includes:    Atacand 16 Mg Tabs (Candesartan cilexetil) ..... Once daily  BP today: 140/90 Prior BP: 110/70 (12/10/2009)  10 Yr Risk Heart Disease: 4 % Prior 10 Yr Risk Heart Disease: 2 % (12/10/2009)  Labs Reviewed: K+: 4.4 (07/03/2009) Creat: : 1.2 (07/03/2009)   Chol: 147 (12/03/2009)   HDL: 38.60 (12/03/2009)   LDL: 85 (12/03/2009)   TG: 118.0 (12/03/2009)  Problem # 2:  HYPERLIPIDEMIA (ICD-272.4)  His updated medication list for this problem includes:    Crestor 20 Mg Tabs (Rosuvastatin calcium) ..... Monday and friday  Labs Reviewed: SGOT: 63 (03/05/2010)   SGPT: 145 (03/05/2010)  Lipid Goals: Chol Goal: 200 (06/04/2007)   HDL Goal: 40 (06/04/2007)   LDL Goal: 130 (06/04/2007)   TG Goal: 150 (06/04/2007)  10 Yr Risk Heart Disease: 4 % Prior 10 Yr Risk Heart Disease: 2 % (12/10/2009)   HDL:38.60 (12/03/2009), 30.70 (07/03/2009)  LDL:85 (12/03/2009), 91 (07/03/2009)  Chol:147 (12/03/2009), 150 (07/03/2009)  Trig:118.0 (12/03/2009), 141.0 (07/03/2009)  Problem # 3:  FATTY LIVER DISEASE (ICD-571.8)  Problem # 4:  HYPERTENSION (ICD-401.9)  His updated medication list for this problem includes:    Atacand 16 Mg Tabs (Candesartan cilexetil) ..... Once daily  BP today: 140/90 Prior BP: 110/70 (12/10/2009)  10 Yr Risk Heart Disease: 4 % Prior 10 Yr Risk Heart Disease: 2 % (12/10/2009)  Labs Reviewed: K+: 4.4 (07/03/2009) Creat: : 1.2  (07/03/2009)   Chol: 147 (12/03/2009)   HDL: 38.60 (12/03/2009)   LDL: 85 (12/03/2009)   TG: 118.0 (12/03/2009)  Complete Medication List: 1)  Atacand 16 Mg Tabs (Candesartan cilexetil) .... Once daily 2)  Crestor 20 Mg Tabs (Rosuvastatin calcium) .... Monday and friday  Hypertension Assessment/Plan:      The patient's hypertensive risk group is category B: At least one risk factor (excluding diabetes) with no target organ damage.  His calculated 10 year risk of coronary heart disease is 4 %.  Today's blood pressure is 140/90.  His blood pressure goal is < 140/90.  Patient Instructions: 1)  Please schedule a follow-up appointment in 3 months. 2)  ten pounds in 3 months

## 2010-10-28 ENCOUNTER — Other Ambulatory Visit (INDEPENDENT_AMBULATORY_CARE_PROVIDER_SITE_OTHER): Payer: Managed Care, Other (non HMO) | Admitting: Internal Medicine

## 2010-10-28 DIAGNOSIS — E785 Hyperlipidemia, unspecified: Secondary | ICD-10-CM

## 2010-10-28 DIAGNOSIS — T887XXA Unspecified adverse effect of drug or medicament, initial encounter: Secondary | ICD-10-CM

## 2010-10-28 LAB — HEPATIC FUNCTION PANEL
ALT: 87 U/L — ABNORMAL HIGH (ref 0–53)
AST: 41 U/L — ABNORMAL HIGH (ref 0–37)
Albumin: 4.3 g/dL (ref 3.5–5.2)
Alkaline Phosphatase: 57 U/L (ref 39–117)
Total Protein: 7.3 g/dL (ref 6.0–8.3)

## 2010-10-28 LAB — LIPID PANEL
Cholesterol: 161 mg/dL (ref 0–200)
LDL Cholesterol: 98 mg/dL (ref 0–99)
Triglycerides: 158 mg/dL — ABNORMAL HIGH (ref 0.0–149.0)

## 2010-11-11 ENCOUNTER — Ambulatory Visit: Payer: Self-pay | Admitting: Internal Medicine

## 2010-11-15 ENCOUNTER — Ambulatory Visit: Payer: Managed Care, Other (non HMO) | Admitting: Internal Medicine

## 2010-11-15 ENCOUNTER — Encounter: Payer: Self-pay | Admitting: Internal Medicine

## 2010-11-15 DIAGNOSIS — I10 Essential (primary) hypertension: Secondary | ICD-10-CM

## 2010-11-15 DIAGNOSIS — J309 Allergic rhinitis, unspecified: Secondary | ICD-10-CM

## 2010-11-15 DIAGNOSIS — K7689 Other specified diseases of liver: Secondary | ICD-10-CM

## 2010-11-15 DIAGNOSIS — E785 Hyperlipidemia, unspecified: Secondary | ICD-10-CM

## 2010-11-15 DIAGNOSIS — Z0000 Encounter for general adult medical examination without abnormal findings: Secondary | ICD-10-CM

## 2010-11-15 DIAGNOSIS — J302 Other seasonal allergic rhinitis: Secondary | ICD-10-CM | POA: Insufficient documentation

## 2010-11-15 MED ORDER — MOMETASONE FUROATE 50 MCG/ACT NA SUSP: 2.0000 | Freq: Every day | NASAL | Status: AC

## 2010-11-15 NOTE — Assessment & Plan Note (Signed)
Stable on atacand

## 2010-11-15 NOTE — Assessment & Plan Note (Signed)
For long-term Nasonex trial given per the recommendations of the ear nose and throat physician.

## 2010-11-15 NOTE — Assessment & Plan Note (Signed)
Stable lipids on crestor  On pulse twice a week pulse

## 2010-11-15 NOTE — Progress Notes (Signed)
  Subjective:    Patient ID: Richard Hancock, male    DOB: Mar 27, 1971, 40 y.o.   MRN: [id number]  HPI    Review of Systems     Objective:   Physical Exam        Assessment & Plan:

## 2010-11-15 NOTE — Assessment & Plan Note (Signed)
Improved liver functions

## 2011-05-18 ENCOUNTER — Telehealth: Payer: Self-pay | Admitting: Internal Medicine

## 2011-05-18 ENCOUNTER — Other Ambulatory Visit: Payer: Self-pay | Admitting: *Deleted

## 2011-05-18 MED ORDER — ROSUVASTATIN CALCIUM 20 MG PO TABS: 20.0000 mg | ORAL_TABLET | Freq: Every day | ORAL | Status: DC

## 2011-05-18 MED ORDER — CANDESARTAN CILEXETIL 16 MG PO TABS: ORAL_TABLET | ORAL | Status: DC

## 2011-05-18 NOTE — Telephone Encounter (Signed)
Pt is requesting refill on Crestor 16MG  and Atacand 16MG   Please send to [facility]

## 2011-06-02 ENCOUNTER — Other Ambulatory Visit: Payer: Self-pay | Admitting: *Deleted

## 2011-06-02 MED ORDER — CANDESARTAN CILEXETIL 16 MG PO TABS: ORAL_TABLET | ORAL | Status: DC

## 2011-06-02 MED ORDER — ROSUVASTATIN CALCIUM 20 MG PO TABS: 20.0000 mg | ORAL_TABLET | Freq: Every day | ORAL | Status: DC

## 2011-06-08 ENCOUNTER — Other Ambulatory Visit (INDEPENDENT_AMBULATORY_CARE_PROVIDER_SITE_OTHER): Payer: Managed Care, Other (non HMO)

## 2011-06-08 DIAGNOSIS — Z0000 Encounter for general adult medical examination without abnormal findings: Secondary | ICD-10-CM

## 2011-06-08 LAB — POCT URINALYSIS DIPSTICK
Blood, UA: NEGATIVE
Nitrite, UA: NEGATIVE
Urobilinogen, UA: 0.2
pH, UA: 5.5

## 2011-06-08 LAB — BASIC METABOLIC PANEL
BUN: 18 mg/dL (ref 6–23)
Calcium: 9.2 mg/dL (ref 8.4–10.5)
Chloride: 104 mEq/L (ref 96–112)
GFR: 68.66 mL/min (ref >60.00)
Glucose, Bld: 92 mg/dL (ref 70–99)
Potassium: 4.2 mEq/L (ref 3.5–5.1)

## 2011-06-08 LAB — LIPID PANEL
HDL: 37.8 mg/dL — ABNORMAL LOW (ref >39.00)
Triglycerides: 173 mg/dL — ABNORMAL HIGH (ref 0.0–149.0)
VLDL: 34.6 mg/dL (ref 0.0–40.0)

## 2011-06-08 LAB — CBC WITH DIFFERENTIAL/PLATELET
Basophils Absolute: 0.1 10*3/uL (ref 0.0–0.1)
Basophils Relative: 0.8 % (ref 0.0–3.0)
Eosinophils Absolute: 0.2 10*3/uL (ref 0.0–0.7)
Lymphocytes Relative: 28.6 % (ref 12.0–46.0)
MCHC: 33 g/dL (ref 30.0–36.0)
Monocytes Relative: 8 % (ref 3.0–12.0)
Neutrophils Relative %: 59.9 % (ref 43.0–77.0)
RBC: 4.96 Mil/uL (ref 4.22–5.81)
RDW: 13.5 % (ref 11.5–14.6)

## 2011-06-08 LAB — HEPATIC FUNCTION PANEL
ALT: 172 U/L — ABNORMAL HIGH (ref 0–53)
AST: 72 U/L — ABNORMAL HIGH (ref 0–37)
Albumin: 4.4 g/dL (ref 3.5–5.2)

## 2011-06-08 LAB — TSH: TSH: 1.04 u[IU]/mL (ref 0.35–5.50)

## 2011-06-15 ENCOUNTER — Encounter: Payer: Self-pay | Admitting: Internal Medicine

## 2011-06-15 ENCOUNTER — Ambulatory Visit (INDEPENDENT_AMBULATORY_CARE_PROVIDER_SITE_OTHER): Payer: Managed Care, Other (non HMO) | Admitting: Internal Medicine

## 2011-06-15 DIAGNOSIS — I10 Essential (primary) hypertension: Secondary | ICD-10-CM

## 2011-06-15 DIAGNOSIS — Z0000 Encounter for general adult medical examination without abnormal findings: Secondary | ICD-10-CM

## 2011-06-15 DIAGNOSIS — G610 Guillain-Barre syndrome: Secondary | ICD-10-CM

## 2011-06-15 DIAGNOSIS — E8881 Metabolic syndrome: Secondary | ICD-10-CM

## 2011-06-15 DIAGNOSIS — K7689 Other specified diseases of liver: Secondary | ICD-10-CM

## 2011-06-15 NOTE — Progress Notes (Signed)
  Subjective:    Patient ID: Richard Hancock, male    DOB: July 12, 1971, 40 y.o.   MRN: [id number]  HPI  Patient is a 40 year old male presents for complete physical examination.  The patient's past history is significant for hypertension just metabolic syndrome a history of hyperlipidemia and a history of fatty infiltration in the liver.  He has had persistently moderately elevated liver functions and ultrasound revealing fatty liver on his screening blood work he is much improved in all parameters with the exception of his triglycerides and his liver enzymes he is on Crestor 20 mg by mouth Monday and Friday. Prior to this his liver functions have been asked for and however this bump in his liver functions wants further evaluation  Review of Systems  Constitutional: Negative for fever and fatigue.  HENT: Negative for hearing loss, congestion, neck pain and postnasal drip.   Eyes: Negative for discharge, redness and visual disturbance.  Respiratory: Negative for cough, shortness of breath and wheezing.   Cardiovascular: Negative for leg swelling.  Gastrointestinal: Negative for abdominal pain, constipation and abdominal distention.  Genitourinary: Negative for urgency and frequency.  Musculoskeletal: Negative for joint swelling and arthralgias.  Skin: Negative for color change and rash.  Neurological: Negative for weakness and light-headedness.  Hematological: Negative for adenopathy.  Psychiatric/Behavioral: Negative for behavioral problems.   Past Medical History  Diagnosis Date  . Hyperlipidemia   . Hypertension    Past Surgical History  Procedure Date  . Denies surgical history     reports that he has never smoked. He does not have any smokeless tobacco history on file. He reports that he does not drink alcohol or use illicit drugs. family history includes Heart attack in an unspecified family member and Hypertension in his mother. No Known Allergies     Objective:   Physical  Exam  Constitutional: He appears well-developed and well-nourished.  HENT:  Head: Normocephalic and atraumatic.  Eyes: Conjunctivae are normal. Pupils are equal, round, and reactive to light.  Neck: Normal range of motion. Neck supple.  Cardiovascular: Normal rate and regular rhythm.   Pulmonary/Chest: Effort normal and breath sounds normal.  Abdominal: Soft. Bowel sounds are normal.  Musculoskeletal: Normal range of motion.  Skin: Skin is warm and dry.  Psychiatric: He has a normal mood and affect. His behavior is normal.          Assessment & Plan:  Due to the elevations in liver functions we will hold the Crestor until we evaluate the liver with an ultrasound and repeat liver functions off the Crestor.  Her pressure stable on his current medications he also does have a history of Bernell List cannot receive a flu shot or tetanus immunization at this time.  Patient presents for yearly preventative medicine examination.   all immunizations and health maintenance protocols were reviewed with the patient and they are up to date with these protocols.   screening laboratory values were reviewed with the patient including screening of hyperlipidemia PSA renal function and hepatic function.   There medications past medical history social history problem list and allergies were reviewed in detail.   Goals were established with regard to weight loss exercise diet in compliance with medications

## 2011-06-15 NOTE — Patient Instructions (Signed)
Weight loss is the key

## 2011-10-25 ENCOUNTER — Other Ambulatory Visit: Payer: Managed Care, Other (non HMO)

## 2011-11-01 ENCOUNTER — Ambulatory Visit: Payer: Managed Care, Other (non HMO) | Admitting: Internal Medicine

## 2011-11-08 ENCOUNTER — Other Ambulatory Visit: Payer: Managed Care, Other (non HMO)

## 2011-11-18 ENCOUNTER — Ambulatory Visit: Payer: Managed Care, Other (non HMO) | Admitting: Internal Medicine

## 2012-06-12 ENCOUNTER — Other Ambulatory Visit: Payer: Managed Care, Other (non HMO)

## 2012-06-13 ENCOUNTER — Other Ambulatory Visit (INDEPENDENT_AMBULATORY_CARE_PROVIDER_SITE_OTHER): Payer: Self-pay

## 2012-06-13 DIAGNOSIS — Z0000 Encounter for general adult medical examination without abnormal findings: Secondary | ICD-10-CM

## 2012-06-13 LAB — CBC WITH DIFFERENTIAL/PLATELET
Basophils Relative: 0.6 % (ref 0.0–3.0)
Eosinophils Absolute: 0.3 10*3/uL (ref 0.0–0.7)
Eosinophils Relative: 4 % (ref 0.0–5.0)
Hemoglobin: 14.1 g/dL (ref 13.0–17.0)
MCHC: 32.6 g/dL (ref 30.0–36.0)
MCV: 87.1 fl (ref 78.0–100.0)
Monocytes Absolute: 0.5 10*3/uL (ref 0.1–1.0)
Neutro Abs: 4.1 10*3/uL (ref 1.4–7.7)
RBC: 4.96 Mil/uL (ref 4.22–5.81)
WBC: 7.1 10*3/uL (ref 4.5–10.5)

## 2012-06-13 LAB — HEPATIC FUNCTION PANEL
ALT: 225 U/L — ABNORMAL HIGH (ref 0–53)
AST: 95 U/L — ABNORMAL HIGH (ref 0–37)
Total Bilirubin: 0.9 mg/dL (ref 0.3–1.2)
Total Protein: 7.3 g/dL (ref 6.0–8.3)

## 2012-06-13 LAB — BASIC METABOLIC PANEL
CO2: 26 mEq/L (ref 19–32)
Chloride: 104 mEq/L (ref 96–112)
Potassium: 4.3 mEq/L (ref 3.5–5.1)
Sodium: 139 mEq/L (ref 135–145)

## 2012-06-13 LAB — LIPID PANEL
LDL Cholesterol: 73 mg/dL (ref 0–99)
Total CHOL/HDL Ratio: 4

## 2012-06-19 ENCOUNTER — Ambulatory Visit (INDEPENDENT_AMBULATORY_CARE_PROVIDER_SITE_OTHER): Payer: BC Managed Care – PPO | Admitting: Internal Medicine

## 2012-06-19 ENCOUNTER — Encounter: Payer: Self-pay | Admitting: Internal Medicine

## 2012-06-19 VITALS — BP 144/90 | HR 72 | Temp 98.2°F | Resp 16 | Ht 68.0 in | Wt 211.0 lb

## 2012-06-19 DIAGNOSIS — Z0000 Encounter for general adult medical examination without abnormal findings: Secondary | ICD-10-CM

## 2012-06-19 DIAGNOSIS — E785 Hyperlipidemia, unspecified: Secondary | ICD-10-CM

## 2012-06-19 NOTE — Patient Instructions (Signed)
Paleo diet handout

## 2012-06-19 NOTE — Progress Notes (Signed)
Subjective:    Patient ID: Richard Hancock, male    DOB: 04/16/1971, 41 y.o.   MRN: [id number]  HPI Patient is a 41 year old male presents for yearly physical examination.  He is followed for hypertension hyperlipidemia and fatty liver.  He also has high risk for diabetes but so far has not developed diabetes.  He has a history of the (cannot get a flu shot   Review of Systems  Constitutional: Negative for fever and fatigue.  HENT: Negative for hearing loss, congestion, neck pain and postnasal drip.   Eyes: Negative for discharge, redness and visual disturbance.  Respiratory: Negative for cough, shortness of breath and wheezing.   Cardiovascular: Negative for leg swelling.  Gastrointestinal: Negative for abdominal pain, constipation and abdominal distention.  Genitourinary: Negative for urgency and frequency.  Musculoskeletal: Negative for joint swelling and arthralgias.  Skin: Negative for color change and rash.  Neurological: Negative for weakness and light-headedness.  Hematological: Negative for adenopathy.  Psychiatric/Behavioral: Negative for behavioral problems.   Past Medical History  Diagnosis Date  . Hyperlipidemia   . Hypertension     History   Social History  . Marital Status: Married    Spouse Name: N/A    Number of Children: N/A  . Years of Education: N/A   Occupational History  . Not on file.   Social History Main Topics  . Smoking status: Never Smoker   . Smokeless tobacco: Not on file  . Alcohol Use: No  . Drug Use: No  . Sexually Active: Yes   Other Topics Concern  . Not on file   Social History Narrative  . No narrative on file    Past Surgical History  Procedure Date  . Denies surgical history     Family History  Problem Relation Age of Onset  . Heart attack    . Hypertension Mother     No Known Allergies  Current Outpatient Prescriptions on File Prior to Visit  Medication Sig Dispense Refill  . candesartan (ATACAND) 16 MG  tablet Take 16 mg by mouth daily.  90 tablet  3  . rosuvastatin (CRESTOR) 20 MG tablet Take 1 tablet (20 mg total) by mouth daily. One tablet by mouth on Monday and Friday  90 tablet  3    BP 144/90  Pulse 72  Temp 98.2 F (36.8 C)  Resp 16  Ht 5\' 8"  (1.727 m)  Wt 211 lb (95.709 kg)  BMI 32.08 kg/m2       Objective:   Physical Exam  Nursing note and vitals reviewed. Constitutional: He is oriented to person, place, and time. He appears well-developed and well-nourished.  HENT:  Head: Normocephalic and atraumatic.  Eyes: Conjunctivae normal are normal. Pupils are equal, round, and reactive to light.  Neck: Normal range of motion. Neck supple.  Cardiovascular: Normal rate and regular rhythm.   Pulmonary/Chest: Effort normal and breath sounds normal.  Abdominal: Soft. Bowel sounds are normal.  Genitourinary: Rectum normal and prostate normal.  Musculoskeletal: Normal range of motion.  Neurological: He is alert and oriented to person, place, and time.  Skin: Skin is warm and dry.  Psychiatric: He has a normal mood and affect. His behavior is normal.          Assessment & Plan:   Patient presents for yearly preventative medicine examination.   all immunizations and health maintenance protocols were reviewed with the patient and they are up to date with these protocols.   screening laboratory values were  reviewed with the patient including screening of hyperlipidemia PSA renal function and hepatic function.   There medications past medical history social history problem list and allergies were reviewed in detail.   Goals were established with regard to weight loss exercise diet in compliance with medications  Patient's fatty liver is of great concern he has not been able to lose weight although his LDL and total cholesterol are definitely within range with Crestor I am concerned about his elevated triglycerides and continued liver inflammation.  Ultrasound has shown fatty  liver in the past we discussed weight loss is the primary intervention.

## 2013-02-01 ENCOUNTER — Telehealth: Payer: Self-pay | Admitting: Internal Medicine

## 2013-02-01 MED ORDER — CANDESARTAN CILEXETIL 16 MG PO TABS: ORAL_TABLET | ORAL | Status: AC

## 2013-02-01 MED ORDER — CANDESARTAN CILEXETIL 16 MG PO TABS: ORAL_TABLET | ORAL | Status: DC

## 2013-02-01 NOTE — Telephone Encounter (Signed)
Patient called stating that he need a refill of his atacand 16 mg 1poqd [facility]. Please assist.

## 2013-02-01 NOTE — Telephone Encounter (Signed)
done

## 2014-04-07 ENCOUNTER — Other Ambulatory Visit: Payer: Self-pay | Admitting: Internal Medicine

## 2018-04-09 ENCOUNTER — Encounter (HOSPITAL_BASED_OUTPATIENT_CLINIC_OR_DEPARTMENT_OTHER): Payer: Self-pay | Admitting: *Deleted

## 2018-04-09 ENCOUNTER — Emergency Department (HOSPITAL_BASED_OUTPATIENT_CLINIC_OR_DEPARTMENT_OTHER)
Admission: EM | Admit: 2018-04-09 | Discharge: 2018-04-09 | Disposition: A | Discharge status: Home / Self Care | Payer: BLUE CROSS/BLUE SHIELD | Attending: Emergency Medicine | Admitting: Emergency Medicine

## 2018-04-09 ENCOUNTER — Other Ambulatory Visit: Payer: Self-pay

## 2018-04-09 DIAGNOSIS — Y999 Unspecified external cause status: Secondary | ICD-10-CM | POA: Diagnosis not present

## 2018-04-09 DIAGNOSIS — S61211A Laceration without foreign body of left index finger without damage to nail, initial encounter: Secondary | ICD-10-CM | POA: Diagnosis present

## 2018-04-09 DIAGNOSIS — I10 Essential (primary) hypertension: Secondary | ICD-10-CM | POA: Insufficient documentation

## 2018-04-09 DIAGNOSIS — W268XXA Contact with other sharp object(s), not elsewhere classified, initial encounter: Secondary | ICD-10-CM | POA: Insufficient documentation

## 2018-04-09 DIAGNOSIS — Y929 Unspecified place or not applicable: Secondary | ICD-10-CM | POA: Insufficient documentation

## 2018-04-09 DIAGNOSIS — Y9389 Activity, other specified: Secondary | ICD-10-CM | POA: Diagnosis not present

## 2018-04-09 MED ORDER — LIDOCAINE HCL 2 % IJ SOLN
INTRAMUSCULAR | Status: AC
Start: 2018-04-09 — End: 2018-04-09
  Administered 2018-04-09: 22:00:00
  Filled 2018-04-09: qty 20

## 2018-04-09 NOTE — Discharge Instructions (Signed)
You were seen in the ED today with a finger laceration. The stitches will need to come out in 7 days. You can return here or go to your PCP. Return sooner with worsening pain, numbness, or redness and/or drainage from the wound.

## 2018-04-09 NOTE — ED Triage Notes (Signed)
Pt c/o lac to left index finger x 2 hrs ago by metal sign

## 2018-04-09 NOTE — ED Provider Notes (Signed)
Emergency Department Provider Note   I have reviewed the triage vital signs and the nursing notes.   HISTORY  Chief Complaint Laceration   HPI Richard Hancock is a 47 y.o. male with PMH of HLD and HTN presents to the ED with evaluation of left finger laceration. He was installing a metal sign when his finger was cut by the sign. Patient's tetanus has been within the last 10 years. He continues to have normal sensation in the finger. Bleeding controlled with pressure. Denies any weakness in the hand. No other injury. Pain is moderate and constant but worse with movement.    Past Medical History:  Diagnosis Date  . Hyperlipidemia   . Hypertension     Patient Active Problem List   Diagnosis Date Noted  . Seasonal allergies 11/15/2010  . 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 FATTY LIVER DISEASE 12/10/2009  . TINEA PEDIS 07/09/2009  . ESOPHAGEAL REFLUX 02/05/2009  . DYSMETABOLIC SYNDROME 12/03/2008  . GUILLAIN-BARRE SYNDROME 04/08/2008  . UNS ADVRS EFF OTH RX MEDICINAL&BIOLOGICAL SBSTNC 02/26/2008  . HYPERLIPIDEMIA 04/13/2007  . HYPERTENSION 04/13/2007    Past Surgical History:  Procedure Laterality Date  . Denies Surgical History      Allergies Patient has no known allergies.  Family History  Problem Relation Age of Onset  . Heart attack Unknown   . Hypertension Mother     Social History Social History   Tobacco Use  . Smoking status: Never Smoker  . Smokeless tobacco: Never Used  Substance Use Topics  . Alcohol use: No  . Drug use: No    Review of Systems  Constitutional: No fever/chills Musculoskeletal: Negative for back pain. Skin: Negative for rash. Positive finger laceration.  Neurological: Negative for headaches, focal weakness or numbness.  10-point ROS otherwise negative.  ____________________________________________   PHYSICAL EXAM:  VITAL SIGNS: ED Triage Vitals  Enc Vitals Group     BP 04/09/18 2138 (!) 190/120     Pulse Rate 04/09/18  2138 75     Resp 04/09/18 2138 18     Temp 04/09/18 2138 98.4 F (36.9 C)     Temp Source 04/09/18 2138 Oral     SpO2 04/09/18 2138 99 %     Weight 04/09/18 2136 230 lb (104.3 kg)     Height 04/09/18 2136 5\' 8"  (1.727 m)     Pain Score 04/09/18 2136 0   Constitutional: Alert and oriented. Well appearing and in no acute distress. Eyes: Conjunctivae are normal. Head: Atraumatic. Nose: No congestion/rhinnorhea. Mouth/Throat: Mucous membranes are moist.   Neck: No stridor.   Respiratory: Normal respiratory effort.   Gastrointestinal: No distention.  Musculoskeletal: No lower extremity tenderness nor edema. No gross deformities of extremities. Normal flexion and extension of the fingers.  Neurologic: No gross focal neurologic deficits are appreciated.  Skin:  Skin is warm and dry. 2 cm laceration to the left index finger along the flexor surface.   ____________________________________________  RADIOLOGY  None ____________________________________________   PROCEDURES  Procedure(s) performed:   Marland KitchenMarland KitchenLaceration Repair Date/Time: 04/09/2018 10:01 PM Performed by: Maia Plan, MD Authorized by: Maia Plan, MD   Consent:    Consent obtained:  Verbal   Consent given by:  Patient   Risks discussed:  Infection, need for additional repair, nerve damage, poor wound healing, poor cosmetic result, pain, retained foreign body, tendon damage and vascular damage   Alternatives discussed:  No treatment Anesthesia (see MAR for exact dosages):    Anesthesia method:  Local infiltration   Local anesthetic:  Lidocaine 1% w/o epi Laceration details:    Location:  Finger   Finger location:  L index finger   Length (cm):  2   Depth (mm):  2 Repair type:    Repair type:  Simple Pre-procedure details:    Preparation:  Patient was prepped and draped in usual sterile fashion Exploration:    Hemostasis achieved with:  Direct pressure   Wound exploration: wound explored through full range of  motion and entire depth of wound probed and visualized     Wound extent: no foreign bodies/material noted, no muscle damage noted, no nerve damage noted, no tendon damage noted, no underlying fracture noted and no vascular damage noted     Contaminated: no   Treatment:    Area cleansed with:  Betadine and saline   Amount of cleaning:  Extensive   Irrigation solution:  Sterile saline   Visualized foreign bodies/material removed: no   Skin repair:    Repair method:  Sutures   Suture size:  3-0   Suture material:  Prolene   Suture technique:  Simple interrupted   Number of sutures:  3 Approximation:    Approximation:  Close Post-procedure details:    Dressing:  Antibiotic ointment and bulky dressing   Patient tolerance of procedure:  Tolerated well, no immediate complications     ____________________________________________   INITIAL IMPRESSION / ASSESSMENT AND PLAN / ED COURSE  Pertinent labs & imaging results that were available during my care of the patient were reviewed by me and considered in my medical decision making (see chart for details).  Patient presents to the ED for evaluation of laceration to the left finger. Tetanus UTD. Normal ROM and strength. Normal neurovascular exam of finger. No nail involvement. Laceration repaired without complication with prolene suture and wound was dressed. Patient to return in 7 week for suture removal.   At this time, I do not feel there is any life-threatening condition present. I have reviewed and discussed all results (EKG, imaging, lab, urine as appropriate), exam findings with patient. I have reviewed nursing notes and appropriate previous records.  I feel the patient is safe to be discharged home without further emergent workup. Discussed usual and customary return precautions. Patient and family (if present) verbalize understanding and are comfortable with this plan.  Patient will follow-up with their primary care provider. If they do  not have a primary care provider, information for follow-up has been provided to them. All questions have been answered.  ____________________________________________  FINAL CLINICAL IMPRESSION(S) / ED DIAGNOSES  Final diagnoses:  Laceration of left index finger without foreign body without damage to nail, initial encounter     MEDICATIONS GIVEN DURING THIS VISIT:  Medications  lidocaine (XYLOCAINE) 2 % (with pres) injection (  Given by Other 04/09/18 2143)    Note:  This document was prepared using Dragon voice recognition software and may include unintentional dictation errors.  Alona BeneJoshua Cybele Maule, MD Emergency Medicine    Niomi Valent, Arlyss RepressJoshua G, MD 04/10/18 [phone]

## 2019-11-11 ENCOUNTER — Encounter: Payer: Self-pay | Admitting: Pulmonary Disease

## 2019-11-11 ENCOUNTER — Other Ambulatory Visit: Payer: Self-pay

## 2019-11-11 ENCOUNTER — Ambulatory Visit (INDEPENDENT_AMBULATORY_CARE_PROVIDER_SITE_OTHER): Payer: BC Managed Care – PPO | Admitting: Pulmonary Disease

## 2019-11-11 VITALS — BP 150/86 | HR 88 | Temp 97.2°F | Ht 68.0 in | Wt 237.2 lb

## 2019-11-11 DIAGNOSIS — G4733 Obstructive sleep apnea (adult) (pediatric): Secondary | ICD-10-CM

## 2019-11-11 NOTE — Patient Instructions (Signed)
Moderate probability of significant obstructive sleep apnea  We will schedule you for home sleep study  Options of treatment as discussed  We will see you back in about 3 months   Sleep Apnea Sleep apnea is a condition in which breathing pauses or becomes shallow during sleep. Episodes of sleep apnea usually last 10 seconds or longer, and they may occur as many as 20 times an hour. Sleep apnea disrupts your sleep and keeps your body from getting the rest that it needs. This condition can increase your risk of certain health problems, including:  Heart attack.  Stroke.  Obesity.  Diabetes.  Heart failure.  Irregular heartbeat. What are the causes? There are three kinds of sleep apnea:  Obstructive sleep apnea. This kind is caused by a blocked or collapsed airway.  Central sleep apnea. This kind happens when the part of the brain that controls breathing does not send the correct signals to the muscles that control breathing.  Mixed sleep apnea. This is a combination of obstructive and central sleep apnea. The most common cause of this condition is a collapsed or blocked airway. An airway can collapse or become blocked if:  Your throat muscles are abnormally relaxed.  Your tongue and tonsils are larger than normal.  You are overweight.  Your airway is smaller than normal. What increases the risk? You are more likely to develop this condition if you:  Are overweight.  Smoke.  Have a smaller than normal airway.  Are elderly.  Are male.  Drink alcohol.  Take sedatives or tranquilizers.  Have a family history of sleep apnea. What are the signs or symptoms? Symptoms of this condition include:  Trouble staying asleep.  Daytime sleepiness and tiredness.  Irritability.  Loud snoring.  Morning headaches.  Trouble concentrating.  Forgetfulness.  Decreased interest in sex.  Unexplained sleepiness.  Mood swings.  Personality changes.  Feelings of  depression.  Waking up often during the night to urinate.  Dry mouth.  Sore throat. How is this diagnosed? This condition may be diagnosed with:  A medical history.  A physical exam.  A series of tests that are done while you are sleeping (sleep study). These tests are usually done in a sleep lab, but they may also be done at home. How is this treated? Treatment for this condition aims to restore normal breathing and to ease symptoms during sleep. It may involve managing health issues that can affect breathing, such as high blood pressure or obesity. Treatment may include:  Sleeping on your side.  Using a decongestant if you have nasal congestion.  Avoiding the use of depressants, including alcohol, sedatives, and narcotics.  Losing weight if you are overweight.  Making changes to your diet.  Quitting smoking.  Using a device to open your airway while you sleep, such as: ? An oral appliance. This is a custom-made mouthpiece that shifts your lower jaw forward. ? A continuous positive airway pressure (CPAP) device. This device blows air through a mask when you breathe out (exhale). ? A nasal expiratory positive airway pressure (EPAP) device. This device has valves that you put into each nostril. ? A bi-level positive airway pressure (BPAP) device. This device blows air through a mask when you breathe in (inhale) and breathe out (exhale).  Having surgery if other treatments do not work. During surgery, excess tissue is removed to create a wider airway. It is important to get treatment for sleep apnea. Without treatment, this condition can lead to:  High blood pressure.  Coronary artery disease.  In men, an inability to achieve or maintain an erection (impotence).  Reduced thinking abilities. Follow these instructions at home: Lifestyle  Make any lifestyle changes that your health care provider recommends.  Eat a healthy, well-balanced diet.  Take steps to lose weight  if you are overweight.  Avoid using depressants, including alcohol, sedatives, and narcotics.  Do not use any products that contain nicotine or tobacco, such as cigarettes, e-cigarettes, and chewing tobacco. If you need help quitting, ask your health care provider. General instructions  Take over-the-counter and prescription medicines only as told by your health care provider.  If you were given a device to open your airway while you sleep, use it only as told by your health care provider.  If you are having surgery, make sure to tell your health care provider you have sleep apnea. You may need to bring your device with you.  Keep all follow-up visits as told by your health care provider. This is important. Contact a health care provider if:  The device that you received to open your airway during sleep is uncomfortable or does not seem to be working.  Your symptoms do not improve.  Your symptoms get worse. Get help right away if:  You develop: ? Chest pain. ? Shortness of breath. ? Discomfort in your back, arms, or stomach.  You have: ? Trouble speaking. ? Weakness on one side of your body. ? Drooping in your face. These symptoms may represent a serious problem that is an emergency. Do not wait to see if the symptoms will go away. Get medical help right away. Call your local emergency services (911 in the [place] Do not drive yourself to the hospital. Summary  Sleep apnea is a condition in which breathing pauses or becomes shallow during sleep.  The most common cause is a collapsed or blocked airway.  The goal of treatment is to restore normal breathing and to ease symptoms during sleep. This information is not intended to replace advice given to you by your health care provider. Make sure you discuss any questions you have with your health care provider. Document Revised: 02/20/2019 Document Reviewed: 05/01/2018 Elsevier Patient Education  [employer].

## 2019-11-11 NOTE — Progress Notes (Signed)
   Subjective:    Patient ID: Richard Hancock, male    DOB: [birth year], 49 y.o.   MRN: [id number]  Patient being seen for history of snoring, witnessed apneas  Many years of snoring Has been trying to lose some weight recently and has noticed some improvement in his sleep quality  Nonrestorative sleep Usually goes to bed between 10 and 11 Falls asleep very easily 1-3 awakenings Final awakening time about 8 AM  Denies any night sweats No nocturia Sleep is nonrestorative Memory is okay Concentration is fair  No family history of obstructive sleep apnea known to him  Past Medical History:  Diagnosis Date  . Hyperlipidemia   . Hypertension      Review of Systems  Constitutional: Negative for fever and unexpected weight change.  HENT: Positive for congestion and sinus pressure. Negative for dental problem, ear pain, nosebleeds, postnasal drip, rhinorrhea, sneezing, sore throat and trouble swallowing.   Eyes: Negative for redness and itching.  Respiratory: Negative for cough, chest tightness, shortness of breath and wheezing.   Cardiovascular: Negative for palpitations and leg swelling.  Gastrointestinal: Negative for nausea and vomiting.  Genitourinary: Negative for dysuria.  Musculoskeletal: Negative for joint swelling.  Skin: Negative for rash.  Allergic/Immunologic: Negative.  Negative for environmental allergies, food allergies and immunocompromised state.  Neurological: Negative for headaches.  Hematological: Does not bruise/bleed easily.  Psychiatric/Behavioral: Negative for dysphoric mood. The patient is not nervous/anxious.       Objective:   Physical Exam Vitals:   11/11/19 1104  BP: (!) 150/86  Pulse: 88  Temp: (!) 97.2 F (36.2 C)  SpO2: 97%   Results of the Epworth flowsheet 11/11/2019  Sitting and reading 1  Watching TV 2  Sitting, inactive in a public place (e.g. a theatre or a meeting) 1  As a passenger in a car for an hour without a break 1  Lying  down to rest in the afternoon when circumstances permit 1  Sitting and talking to someone 1  Sitting quietly after a lunch without alcohol 1  In a car, while stopped for a few minutes in traffic 1  Total score 9       Assessment & Plan:  Moderate probability of significant obstructive sleep apnea  Morbid obesity  Excessive daytime sleepiness  Pathophysiology of sleep disordered breathing discussed with the patient Treatment options for sleep disordered breathing discussed with the patient  Plan We will schedule patient for home sleep study  Treatment options as discussed above  Encouraged to focus on weight loss efforts  I will see him back in the office in about 3 months

## 2019-12-03 DIAGNOSIS — G4733 Obstructive sleep apnea (adult) (pediatric): Secondary | ICD-10-CM

## 2019-12-06 ENCOUNTER — Other Ambulatory Visit: Payer: Self-pay

## 2019-12-06 ENCOUNTER — Ambulatory Visit: Payer: BC Managed Care – PPO

## 2019-12-06 DIAGNOSIS — G4733 Obstructive sleep apnea (adult) (pediatric): Secondary | ICD-10-CM

## 2019-12-24 ENCOUNTER — Telehealth: Payer: Self-pay | Admitting: Pulmonary Disease

## 2019-12-24 DIAGNOSIS — G4733 Obstructive sleep apnea (adult) (pediatric): Secondary | ICD-10-CM

## 2019-12-24 NOTE — Telephone Encounter (Signed)
Spoke with pt. He was very upset that it has taken so long to get someone on the phone. Due to him being irate, I have relayed his sleep study results to him per Dr. Trena Platt interpretation on his HST. Study showed >> severe sleep apnea and Dr. Wynona Neat recommends auto CPAP. Advised pt that we would send the order to a DME and he would need a ROV in 31-90 days after starting therapy. I offered to go ahead and make this appointment for him but he declined and stated that he would call back after getting his CPAP machine. Order has been placed. Nothing further needed.

## 2019-12-24 NOTE — Telephone Encounter (Signed)
Pt returning a phone call. Pt can be reached at [phone].

## 2019-12-24 NOTE — Telephone Encounter (Signed)
LMTCB x1 for pt.  

## 2020-05-13 ENCOUNTER — Encounter: Payer: Self-pay | Admitting: Pulmonary Disease

## 2020-05-13 ENCOUNTER — Ambulatory Visit (INDEPENDENT_AMBULATORY_CARE_PROVIDER_SITE_OTHER): Payer: BC Managed Care – PPO | Admitting: Pulmonary Disease

## 2020-05-13 ENCOUNTER — Other Ambulatory Visit: Payer: Self-pay

## 2020-05-13 VITALS — BP 114/94 | HR 93 | Temp 97.9°F | Ht 69.0 in | Wt 238.2 lb

## 2020-05-13 DIAGNOSIS — G4733 Obstructive sleep apnea (adult) (pediatric): Secondary | ICD-10-CM | POA: Diagnosis not present

## 2020-05-13 DIAGNOSIS — Z9989 Dependence on other enabling machines and devices: Secondary | ICD-10-CM

## 2020-05-13 NOTE — Patient Instructions (Signed)
Continue with CPAP  Follow up in 6 months.

## 2020-05-13 NOTE — Progress Notes (Signed)
° °  Subjective:    Patient ID: Richard Hancock, male    DOB: 1970/10/25, 49 y.o.   MRN: [id number]  Patient being seen for history of snoring, witnessed apneas  Many years of snoring Has been trying to lose some weight recently and has noticed some improvement in his sleep quality  Nonrestorative sleep Usually goes to bed between 10 and 11 Falls asleep very easily 1-3 awakenings Final awakening time about 8 AM  Denies any night sweats No nocturia Sleep is nonrestorative Memory is okay Concentration is fair  Diagnosis of obstructive sleep apnea Has been getting used to using CPAP on a regular basis and seems to be doing well  Past Medical History:  Diagnosis Date   Hyperlipidemia    Hypertension      Review of Systems  Constitutional: Negative for fever and unexpected weight change.  HENT: Positive for congestion. Negative for dental problem, ear pain, nosebleeds, postnasal drip, rhinorrhea, sinus pressure, sneezing, sore throat and trouble swallowing.   Eyes: Negative for redness and itching.  Respiratory: Negative for cough, chest tightness, shortness of breath and wheezing.   Cardiovascular: Negative for palpitations and leg swelling.  Gastrointestinal: Negative for nausea and vomiting.  Genitourinary: Negative for dysuria.  Skin: Negative for rash.  Allergic/Immunologic: Negative.       Objective:   Physical Exam Constitutional:      Appearance: He is obese.  HENT:     Head: Normocephalic.     Mouth/Throat:     Mouth: Mucous membranes are moist.  Cardiovascular:     Rate and Rhythm: Normal rate and regular rhythm.     Heart sounds: No murmur heard.  No friction rub. No gallop.   Pulmonary:     Effort: No respiratory distress.     Breath sounds: No stridor. No wheezing or rhonchi.  Neurological:     Mental Status: He is alert.    Vitals:   05/13/20 1411  BP: (!) 114/94  Pulse: 93  Temp: 97.9 F (36.6 C)  SpO2: 95%   Results of the Epworth  flowsheet 11/11/2019  Sitting and reading 1  Watching TV 2  Sitting, inactive in a public place (e.g. a theatre or a meeting) 1  As a passenger in a car for an hour without a break 1  Lying down to rest in the afternoon when circumstances permit 1  Sitting and talking to someone 1  Sitting quietly after a lunch without alcohol 1  In a car, while stopped for a few minutes in traffic 1  Total score 9   Compliance data shows 90% compliance Machine set between 5 and 20 Residual AHI of 2.8    Assessment & Plan:  Severe obstructive sleep apnea -On CPAP therapy  Morbid obesity -Exercising regularly and is managed to lose a few pounds already  Excessive daytime sleepiness -Definitely not as sleepy as prior to CPAP use  He is benefiting from using CPAP on a regular basis.  Plan Continue CPAP on a regular basis  Encouraged to focus on weight loss efforts  I will see him back in the office in about  6 months

## 2023-07-24 ENCOUNTER — Encounter (HOSPITAL_COMMUNITY): Payer: Self-pay

## 2023-07-24 ENCOUNTER — Emergency Department (HOSPITAL_COMMUNITY): Payer: No Typology Code available for payment source

## 2023-07-24 ENCOUNTER — Other Ambulatory Visit: Payer: Self-pay

## 2023-07-24 ENCOUNTER — Emergency Department (HOSPITAL_COMMUNITY)
Admission: EM | Admit: 2023-07-24 | Discharge: 2023-07-24 | Disposition: A | Discharge status: Home / Self Care | Payer: No Typology Code available for payment source | Attending: Emergency Medicine | Admitting: Emergency Medicine

## 2023-07-24 DIAGNOSIS — R519 Headache, unspecified: Secondary | ICD-10-CM | POA: Diagnosis present

## 2023-07-24 DIAGNOSIS — Z79899 Other long term (current) drug therapy: Secondary | ICD-10-CM | POA: Diagnosis not present

## 2023-07-24 DIAGNOSIS — I10 Essential (primary) hypertension: Secondary | ICD-10-CM | POA: Diagnosis not present

## 2023-07-24 LAB — BASIC METABOLIC PANEL
Anion gap: 9 (ref 5–15)
BUN: 21 mg/dL — ABNORMAL HIGH (ref 6–20)
CO2: 25 mmol/L (ref 22–32)
Calcium: 9.5 mg/dL (ref 8.9–10.3)
Chloride: 104 mmol/L (ref 98–111)
Creatinine, Ser: 1.58 mg/dL — ABNORMAL HIGH (ref 0.61–1.24)
GFR, Estimated: 52 mL/min — ABNORMAL LOW (ref >60)
Glucose, Bld: 115 mg/dL — ABNORMAL HIGH (ref 70–99)
Potassium: 3.3 mmol/L — ABNORMAL LOW (ref 3.5–5.1)
Sodium: 138 mmol/L (ref 135–145)

## 2023-07-24 LAB — CBC
HCT: 37.7 % — ABNORMAL LOW (ref 39.0–52.0)
Hemoglobin: 12.7 g/dL — ABNORMAL LOW (ref 13.0–17.0)
MCH: 28.6 pg (ref 26.0–34.0)
MCHC: 33.7 g/dL (ref 30.0–36.0)
MCV: 84.9 fL (ref 80.0–100.0)
Platelets: 224 10*3/uL (ref 150–400)
RBC: 4.44 MIL/uL (ref 4.22–5.81)
RDW: 13.1 % (ref 11.5–15.5)
WBC: 6.8 10*3/uL (ref 4.0–10.5)
nRBC: 0 % (ref 0.0–0.2)

## 2023-07-24 LAB — TROPONIN I (HIGH SENSITIVITY): Troponin I (High Sensitivity): 8 ng/L (ref <18)

## 2023-07-24 MED ORDER — POTASSIUM CHLORIDE CRYS ER 20 MEQ PO TBCR
40.0000 meq | EXTENDED_RELEASE_TABLET | Freq: Once | ORAL | Status: AC
  Administered 2023-07-24: 40 meq via ORAL
  Filled 2023-07-24: qty 2

## 2023-07-24 MED ORDER — METOCLOPRAMIDE HCL 5 MG/ML IJ SOLN
10.0000 mg | Freq: Once | INTRAMUSCULAR | Status: AC
  Administered 2023-07-24: 10 mg via INTRAVENOUS
  Filled 2023-07-24: qty 2

## 2023-07-24 MED ORDER — KETOROLAC TROMETHAMINE 15 MG/ML IJ SOLN
15.0000 mg | Freq: Once | INTRAMUSCULAR | Status: AC
Start: 2023-07-24 — End: 2023-07-24
  Administered 2023-07-24: 15 mg via INTRAVENOUS
  Filled 2023-07-24: qty 1

## 2023-07-24 MED ORDER — AMLODIPINE BESYLATE 5 MG PO TABS
10.0000 mg | ORAL_TABLET | Freq: Once | ORAL | Status: AC
  Administered 2023-07-24: 10 mg via ORAL
  Filled 2023-07-24: qty 2

## 2023-07-24 MED ORDER — DIPHENHYDRAMINE HCL 50 MG/ML IJ SOLN
12.5000 mg | Freq: Once | INTRAMUSCULAR | Status: AC
Start: 2023-07-24 — End: 2023-07-24
  Administered 2023-07-24: 12.5 mg via INTRAVENOUS
  Filled 2023-07-24: qty 1

## 2023-07-24 MED ORDER — LACTATED RINGERS IV BOLUS
1000.0000 mL | Freq: Once | INTRAVENOUS | Status: AC
  Administered 2023-07-24: 1000 mL via INTRAVENOUS

## 2023-07-24 NOTE — ED Provider Notes (Signed)
[place] EMERGENCY DEPARTMENT AT [facility] Provider Note   CSN: [id number] Arrival date & time: 07/24/23  1850     History  Chief Complaint  Patient presents with   Hypertension    Richard Hancock is a 52 y.o. male.   Hypertension Associated symptoms include headaches.  Patient presents for headache and hypertension.  Medical history includes HTN, HLD, GERD.  Home medications include 10 mg of amlodipine daily, olmesartan-HCTZ 40-12.5 mg daily.  This morning he had a headache.  Blood pressure measured at home was 244/142.  He took an extra dose of his olmesartan this afternoon.  For his headache, he has taken Tylenol this morning and this afternoon.  Headache continues.  He describes it as posterior and 8/10 in severity.  He denies any other new symptoms today.  He denies any recent trauma.  Patient states that he is to check his blood pressure frequently.  Prior to today, he has not checked it in several months.  He has not had any recent medication changes.     Home Medications Prior to Admission medications   Medication Sig Start Date End Date Taking? Authorizing Provider  amLODipine (NORVASC) 10 MG tablet Take 10 mg by mouth daily.   Yes [provider]  DM-GG & DM-APAP-CPM (CORICIDIN HBP DAY/NIGHT COLD) 10-20 &15-200-2 MG MISC Take 1 capsule by mouth every 6 (six) hours as needed (for cold-like symptoms and discomfort).   Yes [provider]  olmesartan-hydrochlorothiazide (BENICAR HCT) 40-12.5 MG tablet Take 1 tablet by mouth daily.   Yes [provider]  rosuvastatin (CRESTOR) 5 MG tablet Take 5 mg by mouth every other day.   Yes [provider]  TURMERIC PO Take 1 capsule by mouth 3 (three) times a week.   Yes [provider]  TYLENOL 500 MG tablet Take 500-1,000 mg by mouth every 6 (six) hours as needed for mild pain (pain score 1-3) or headache.   Yes [provider]  candesartan (ATACAND) 16 MG tablet Take  16 mg by mouth daily. Patient not taking: Reported on 07/24/2023 02/01/13   Stacie Glaze, MD      Allergies    Fluviral [influenza virus vaccine]    Review of Systems   Review of Systems  Neurological:  Positive for headaches.  All other systems reviewed and are negative.   Physical Exam Updated Vital Signs BP (!) 189/115   Pulse 82   Temp 98.8 F (37.1 C) (Oral)   Resp 14   Ht 5\' 9"  (1.753 m)   Wt 108.9 kg   SpO2 96%   BMI 35.44 kg/m  Physical Exam Vitals and nursing note reviewed.  Constitutional:      General: He is not in acute distress.    Appearance: Normal appearance. He is well-developed. He is not ill-appearing, toxic-appearing or diaphoretic.  HENT:     Head: Normocephalic and atraumatic.     Right Ear: External ear normal.     Left Ear: External ear normal.     Nose: Nose normal.     Mouth/Throat:     Mouth: Mucous membranes are moist.  Eyes:     Extraocular Movements: Extraocular movements intact.     Conjunctiva/sclera: Conjunctivae normal.  Cardiovascular:     Rate and Rhythm: Normal rate and regular rhythm.  Pulmonary:     Effort: Pulmonary effort is normal. No respiratory distress.  Abdominal:     General: There is no distension.  Palpations: Abdomen is soft.  Musculoskeletal:        General: No swelling or deformity. Normal range of motion.     Cervical back: Normal range of motion and neck supple.  Skin:    General: Skin is warm and dry.  Neurological:     General: No focal deficit present.     Mental Status: He is alert and oriented to person, place, and time.     Cranial Nerves: No cranial nerve deficit.     Sensory: No sensory deficit.     Motor: No weakness.     Coordination: Coordination normal.  Psychiatric:        Mood and Affect: Mood normal.        Behavior: Behavior normal.     ED Results / Procedures / Treatments   Labs (all labs ordered are listed, but only abnormal results are displayed) Labs Reviewed  BASIC  METABOLIC PANEL - Abnormal; Notable for the following components:      Result Value   Potassium 3.3 (*)    Glucose, Bld 115 (*)    BUN 21 (*)    Creatinine, Ser 1.58 (*)    GFR, Estimated 52 (*)    All other components within normal limits  CBC - Abnormal; Notable for the following components:   Hemoglobin 12.7 (*)    HCT 37.7 (*)    All other components within normal limits  TROPONIN I (HIGH SENSITIVITY)  TROPONIN I (HIGH SENSITIVITY)    EKG EKG Interpretation Date/Time:  Monday July 24 2023 20:11:40 EST Ventricular Rate:  79 PR Interval:  188 QRS Duration:  107 QT Interval:  389 QTC Calculation: 446 R Axis:   37  Text Interpretation: Sinus rhythm Incomplete RBBB and LAFB Confirmed by Gloris Manchester ([id number]) on 07/24/2023 10:29:05 PM  Radiology CT Head Wo Contrast  Result Date: 07/24/2023 CLINICAL DATA:  New onset headache EXAM: CT HEAD WITHOUT CONTRAST TECHNIQUE: Contiguous axial images were obtained from the base of the skull through the vertex without intravenous contrast. RADIATION DOSE REDUCTION: This exam was performed according to the departmental dose-optimization program which includes automated exposure control, adjustment of the mA and/or kV according to patient size and/or use of iterative reconstruction technique. COMPARISON:  None Available. FINDINGS: Brain: No acute intracranial hemorrhage. No focal mass lesion. No CT evidence of acute infarction. No midline shift or mass effect. No hydrocephalus. Basilar cisterns are patent. Vascular: No hyperdense vessel or unexpected calcification. Skull: Normal. Negative for fracture or focal lesion. Sinuses/Orbits: Paranasal sinuses and mastoid air cells are clear. Orbits are clear. Other: None. IMPRESSION: Normal head CT. Electronically Signed   By: Genevive Bi M.D.   On: 07/24/2023 21:44   DG Chest 2 View  Result Date: 07/24/2023 CLINICAL DATA:  Headache, hypertension EXAM: CHEST - 2 VIEW COMPARISON:  None Available.  FINDINGS: Lungs are clear.  No pleural effusion or pneumothorax. The heart is normal in size. Visualized osseous structures are within normal limits. IMPRESSION: Normal chest radiographs. Electronically Signed   By: Charline Bills M.D.   On: 07/24/2023 21:03    Procedures Procedures    Medications Ordered in ED Medications  metoCLOPramide (REGLAN) injection 10 mg (10 mg Intravenous Given 07/24/23 2158)  diphenhydrAMINE (BENADRYL) injection 12.5 mg (12.5 mg Intravenous Given 07/24/23 2158)  ketorolac (TORADOL) 15 MG/ML injection 15 mg (15 mg Intravenous Given 07/24/23 2158)  lactated ringers bolus 1,000 mL (1,000 mLs Intravenous New Bag/Given 07/24/23 2158)  amLODipine (NORVASC) tablet 10 mg (10 mg  Oral Given 07/24/23 2157)  potassium chloride SA (KLOR-CON M) CR tablet 40 mEq (40 mEq Oral Given 07/24/23 2249)    ED Course/ Medical Decision Making/ A&P                                 Medical Decision Making Amount and/or Complexity of Data Reviewed Labs: ordered. Radiology: ordered.  Risk Prescription drug management.   This patient presents to the ED for concern of headache and hypertension, this involves an extensive number of treatment options, and is a complaint that carries with it a high risk of complications and morbidity.  The differential diagnosis includes hypertensive crisis, uncontrolled hypertension, medication nonadherence, ICH, migraine, tension headache   Co morbidities that complicate the patient evaluation  HTN, HLD, GERD   Additional history obtained:  Additional history obtained from patient's significant other External records from outside source obtained and reviewed including EMR   Lab Tests:  I Ordered, and personally interpreted labs.  The pertinent results include: Baseline creatinine, mild hypokalemia with otherwise normal electrolytes, normal troponin   Imaging Studies ordered:  I ordered imaging studies including chest x-ray, CT head I  independently visualized and interpreted imaging which showed no acute findings I agree with the radiologist interpretation   Cardiac Monitoring: / EKG:  The patient was maintained on a cardiac monitor.  I personally viewed and interpreted the cardiac monitored which showed an underlying rhythm of: Sinus rhythm   Problem List / ED Course / Critical interventions / Medication management  Patient presents for hypertension and headache.  Blood pressure at home was elevated in the range of 240s over 140s.  He did take an extra dose of his olmesartan this afternoon.  On arrival in the ED, blood pressure remains elevated in the range of 230/140s.  At time of being bedded in the ED, blood pressure has improved to 191/120.  On assessment, patient is well-appearing.  He has no focal neurologic deficits.  He does endorse an ongoing posterior headache.  Headache cocktail was ordered.  Workup was initiated to assess for possible endorgan damage.  Patient's evening dose of amlodipine was given.  CT head did not show acute findings.  Creatinine is baseline.  On reassessment, patient has resolution of headache.  Blood pressure is improved to 160 SBP.  He reports that this is typically where it stays at based on blood pressure measurements for months ago.  He was advised to continue to observe his blood pressures at home and talk to his PCP about ongoing management of hypertension.  He was discharged in good condition. I ordered medication including IV fluids, Reglan, Toradol, Benadryl for headache; potassium chloride for hypokalemia; amlodipine for hypertension Reevaluation of the patient after these medicines showed that the patient resolved I have reviewed the patients home medicines and have made adjustments as needed   Social Determinants of Health:  Has PCP         Final Clinical Impression(s) / ED Diagnoses Final diagnoses:  Bad headache    Rx / DC Orders ED Discharge Orders     None          Gloris Manchester, MD 07/24/23 2306

## 2023-07-24 NOTE — Discharge Instructions (Signed)
Monitor blood pressure at home when you are comfortable and pain-free.  Discuss ongoing management of your hypertension with your primary care doctor.  Return to the emergency department for any new or worsening symptoms of concern.

## 2023-07-24 NOTE — ED Triage Notes (Signed)
Pt POV from home d/t HA since this morning and took BP  - was 244/142.   Pt took an extra Sartan and d/t to have the Amlodopine and Sartan at 2000 but does not have with him.
